# Patient Record
Sex: Female | Born: 1980 | Race: White | Hispanic: No | Marital: Married | Smoking: Never smoker
Health system: Southern US, Community
[De-identification: ages and names within clinical notes are randomized; demographics above are authoritative.]

## PROBLEM LIST (undated history)

## (undated) DIAGNOSIS — N189 Chronic kidney disease, unspecified: Secondary | ICD-10-CM

## (undated) DIAGNOSIS — R011 Cardiac murmur, unspecified: Secondary | ICD-10-CM

## (undated) DIAGNOSIS — N200 Calculus of kidney: Secondary | ICD-10-CM

## (undated) DIAGNOSIS — D649 Anemia, unspecified: Secondary | ICD-10-CM

## (undated) HISTORY — DX: Anemia, unspecified: D64.9

## (undated) HISTORY — PX: OTHER SURGICAL HISTORY: SHX169

## (undated) HISTORY — PX: WISDOM TOOTH EXTRACTION: SHX21

## (undated) HISTORY — DX: Cardiac murmur, unspecified: R01.1

---
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
## 2010-04-26 ENCOUNTER — Ambulatory Visit (HOSPITAL_COMMUNITY): Admission: RE | Admit: 2010-04-26 | Discharge: 2010-04-26 | Payer: Self-pay | Admitting: Urology

## 2011-04-13 LAB — ANTIBODY SCREEN: Antibody Screen: NEGATIVE

## 2011-04-13 LAB — CBC
HCT: 37 % (ref 36–46)
Hemoglobin: 12.4 g/dL (ref 12.0–16.0)

## 2011-04-13 LAB — RPR: RPR: NONREACTIVE

## 2011-08-19 IMAGING — NM NM RENAL IMAGING FLOW W/ PHARM
2 series · 12 of 12 positions shown · non-contrast
Comparison: CT urogram from [HOSPITAL] 04/12/2010.

CLINICAL DATA: Nonfunctioning right kidney.  History of right
kidney stone surgery in 1879.

NUCLEAR MEDICINE RENAL SCINTIANGIOGRAPHY WITH FLOW AND FUNCTION AND
PHARMACOLOGIC AUGMENTATION
TECHNIQUE: Radionuclide angiographic and sequential renal images
were obtained after intravenous injection of radiopharmaceutical.
Imaging was continued during slow intravenous injection of Lasix
approximately 20-30 minutes after the start of the examination.
Radiopharmaceutical: 15.0 mCi technetium 99m MAG3 intravenously.

[Series 1: re renal qualitative · 9.44mm/px · 6 of 121 frames shown (1 of 2)]
[frame 11/121]
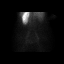
[frame 31/121]
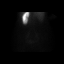
[frame 51/121]
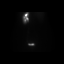
[frame 71/121]
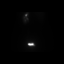
[frame 91/121]
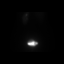
[frame 111/121]
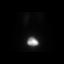

[Series 1: re renal qualitative · 9.44mm/px · 6 of 121 frames shown (2 of 2)]
[frame 11/121]
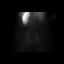
[frame 31/121]
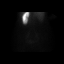
[frame 51/121]
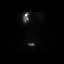
[frame 71/121]
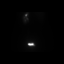
[frame 91/121]
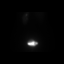
[frame 111/121]
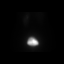

[12 of 12 positions shown; findings below may reference images not displayed]

FINDINGS: Perfusion images demonstrate a good aortic bolus with
normal early enhancement of the left kidney.  There is delayed
minimal right renal enhancement.  Differential renal uptake at 2-3
minutes is 2% on the right and 98% on the left.

Renogram images demonstrate normal left renal cortical uptake.
There is no definite right renal cortical uptake.  Activity in the
right upper quadrant is probably related to the liver.  There is
normal excretion into the left collecting system prior to Lasix
administration.  No definite collecting system activity is
demonstrated on the right.  There is progressive bladder activity.

Differential renal function is 3% right and 97% left.  Post Lasix T
[DATE] times are 6.0 minutes on the right and 6.5 minutes on the left.
IMPRESSION: 1.  Minimal if any right renal cortical uptake and excretion are
demonstrated.  Differential function is approximately 98% left and
2% right.
2.  No evidence of collecting system obstruction on the left.

## 2011-10-30 ENCOUNTER — Encounter (HOSPITAL_COMMUNITY): Payer: Self-pay | Admitting: Anesthesiology

## 2011-10-30 ENCOUNTER — Encounter (HOSPITAL_COMMUNITY): Payer: Self-pay

## 2011-10-30 ENCOUNTER — Inpatient Hospital Stay (HOSPITAL_COMMUNITY): Payer: 59 | Admitting: Anesthesiology

## 2011-10-30 ENCOUNTER — Inpatient Hospital Stay (HOSPITAL_COMMUNITY)
Admission: AD | Admit: 2011-10-30 | Discharge: 2011-11-01 | DRG: 775 | Disposition: A | Discharge status: Home / Self Care | Payer: 59 | Source: Ambulatory Visit | Attending: Obstetrics and Gynecology | Admitting: Obstetrics and Gynecology

## 2011-10-30 DIAGNOSIS — Z22330 Carrier of Group B streptococcus: Secondary | ICD-10-CM

## 2011-10-30 DIAGNOSIS — O99892 Other specified diseases and conditions complicating childbirth: Secondary | ICD-10-CM | POA: Diagnosis present

## 2011-10-30 HISTORY — DX: Calculus of kidney: N20.0

## 2011-10-30 HISTORY — DX: Chronic kidney disease, unspecified: N18.9

## 2011-10-30 LAB — URINALYSIS, MICROSCOPIC ONLY
Bilirubin Urine: NEGATIVE
Nitrite: NEGATIVE
Specific Gravity, Urine: 1.015 (ref 1.005–1.030)

## 2011-10-30 LAB — CBC
Hemoglobin: 12.2 g/dL (ref 12.0–15.0)
MCH: 30.9 pg (ref 26.0–34.0)
Platelets: 217 10*3/uL (ref 150–400)
RDW: 12 % (ref 11.5–15.5)
WBC: 11.5 10*3/uL — ABNORMAL HIGH (ref 4.0–10.5)

## 2011-10-30 LAB — COMPREHENSIVE METABOLIC PANEL
Alkaline Phosphatase: 128 U/L — ABNORMAL HIGH (ref 39–117)
BUN: 9 mg/dL (ref 6–23)
CO2: 23 mEq/L (ref 19–32)
Calcium: 9 mg/dL (ref 8.4–10.5)
GFR calc Af Amer: >90 mL/min (ref >90)
GFR calc non Af Amer: >90 mL/min (ref >90)
Glucose, Bld: 101 mg/dL — ABNORMAL HIGH (ref 70–99)
Sodium: 134 mEq/L — ABNORMAL LOW (ref 135–145)
Total Bilirubin: 0.2 mg/dL — ABNORMAL LOW (ref 0.3–1.2)

## 2011-10-30 MED ORDER — DEXTROSE 5 % IV SOLN
2.5000 10*6.[IU] | INTRAVENOUS | Status: DC
  Administered 2011-10-30 (×2): 2.5 10*6.[IU] via INTRAVENOUS
  Filled 2011-10-30 (×4): qty 2.5

## 2011-10-30 MED ORDER — MEASLES, MUMPS & RUBELLA VAC ~~LOC~~ INJ: 0.5000 mL | INJECTION | Freq: Once | SUBCUTANEOUS | Status: DC

## 2011-10-30 MED ORDER — IBUPROFEN 600 MG PO TABS
600.0000 mg | ORAL_TABLET | Freq: Four times a day (QID) | ORAL | Status: DC | PRN
  Administered 2011-10-30: 600 mg via ORAL
  Filled 2011-10-30: qty 1

## 2011-10-30 MED ORDER — FENTANYL 2.5 MCG/ML BUPIVACAINE 1/10 % EPIDURAL INFUSION (WH - ANES)
14.0000 mL/h | INTRAMUSCULAR | Status: DC
  Administered 2011-10-30 (×3): 14 mL/h via EPIDURAL
  Filled 2011-10-30 (×3): qty 60

## 2011-10-30 MED ORDER — LANOLIN HYDROUS EX OINT: TOPICAL_OINTMENT | CUTANEOUS | Status: DC | PRN

## 2011-10-30 MED ORDER — IBUPROFEN 600 MG PO TABS
600.0000 mg | ORAL_TABLET | Freq: Four times a day (QID) | ORAL | Status: DC
  Administered 2011-10-31 – 2011-11-01 (×5): 600 mg via ORAL
  Filled 2011-10-30 (×5): qty 1

## 2011-10-30 MED ORDER — BUTORPHANOL TARTRATE 2 MG/ML IJ SOLN: 1.0000 mg | INTRAMUSCULAR | Status: DC | PRN

## 2011-10-30 MED ORDER — BENZOCAINE-MENTHOL 20-0.5 % EX AERO
INHALATION_SPRAY | CUTANEOUS | Status: AC
  Filled 2011-10-30: qty 56

## 2011-10-30 MED ORDER — PHENYLEPHRINE 40 MCG/ML (10ML) SYRINGE FOR IV PUSH (FOR BLOOD PRESSURE SUPPORT): 80.0000 ug | PREFILLED_SYRINGE | INTRAVENOUS | Status: DC | PRN

## 2011-10-30 MED ORDER — DIPHENHYDRAMINE HCL 50 MG/ML IJ SOLN: 12.5000 mg | INTRAMUSCULAR | Status: DC | PRN

## 2011-10-30 MED ORDER — ONDANSETRON HCL 4 MG PO TABS: 4.0000 mg | ORAL_TABLET | ORAL | Status: DC | PRN

## 2011-10-30 MED ORDER — ACETAMINOPHEN 325 MG PO TABS
650.0000 mg | ORAL_TABLET | ORAL | Status: DC | PRN
  Administered 2011-10-30: 650 mg via ORAL
  Filled 2011-10-30: qty 2

## 2011-10-30 MED ORDER — LIDOCAINE HCL (PF) 1 % IJ SOLN
30.0000 mL | INTRAMUSCULAR | Status: DC | PRN
  Filled 2011-10-30: qty 30

## 2011-10-30 MED ORDER — PENICILLIN G POTASSIUM 5000000 UNITS IJ SOLR
5.0000 10*6.[IU] | Freq: Once | INTRAVENOUS | Status: AC
  Administered 2011-10-30: 5 10*6.[IU] via INTRAVENOUS
  Filled 2011-10-30: qty 5

## 2011-10-30 MED ORDER — EPHEDRINE 5 MG/ML INJ
10.0000 mg | INTRAVENOUS | Status: DC | PRN
  Filled 2011-10-30: qty 4

## 2011-10-30 MED ORDER — LACTATED RINGERS IV SOLN
INTRAVENOUS | Status: DC
  Administered 2011-10-30 (×2): via INTRAVENOUS

## 2011-10-30 MED ORDER — MEDROXYPROGESTERONE ACETATE 150 MG/ML IM SUSP: 150.0000 mg | INTRAMUSCULAR | Status: DC | PRN

## 2011-10-30 MED ORDER — OXYTOCIN BOLUS FROM INFUSION
500.0000 mL | Freq: Once | INTRAVENOUS | Status: AC
  Administered 2011-10-30: 500 mL via INTRAVENOUS
  Filled 2011-10-30: qty 500
  Filled 2011-10-30: qty 1000

## 2011-10-30 MED ORDER — SIMETHICONE 80 MG PO CHEW: 80.0000 mg | CHEWABLE_TABLET | ORAL | Status: DC | PRN

## 2011-10-30 MED ORDER — BENZONATATE 100 MG PO CAPS: 100.0000 mg | ORAL_CAPSULE | Freq: Three times a day (TID) | ORAL | Status: DC | PRN

## 2011-10-30 MED ORDER — FLEET ENEMA 7-19 GM/118ML RE ENEM: 1.0000 | ENEMA | RECTAL | Status: DC | PRN

## 2011-10-30 MED ORDER — OXYTOCIN 20 UNITS IN LACTATED RINGERS INFUSION - SIMPLE: 125.0000 mL/h | Freq: Once | INTRAVENOUS | Status: DC

## 2011-10-30 MED ORDER — BENZONATATE 100 MG PO CAPS
100.0000 mg | ORAL_CAPSULE | Freq: Three times a day (TID) | ORAL | Status: DC | PRN
  Administered 2011-10-30: 100 mg via ORAL
  Filled 2011-10-30: qty 1

## 2011-10-30 MED ORDER — LACTATED RINGERS IV SOLN: 500.0000 mL | Freq: Once | INTRAVENOUS | Status: DC

## 2011-10-30 MED ORDER — OXYCODONE-ACETAMINOPHEN 5-325 MG PO TABS: 1.0000 | ORAL_TABLET | ORAL | Status: DC | PRN

## 2011-10-30 MED ORDER — TETANUS-DIPHTH-ACELL PERTUSSIS 5-2.5-18.5 LF-MCG/0.5 IM SUSP: 0.5000 mL | Freq: Once | INTRAMUSCULAR | Status: DC

## 2011-10-30 MED ORDER — PRENATAL MULTIVITAMIN CH
1.0000 | ORAL_TABLET | Freq: Every day | ORAL | Status: DC
  Administered 2011-10-31: 1 via ORAL
  Filled 2011-10-30 (×2): qty 1

## 2011-10-30 MED ORDER — BENZOCAINE-MENTHOL 20-0.5 % EX AERO: 1.0000 "application " | INHALATION_SPRAY | CUTANEOUS | Status: DC | PRN

## 2011-10-30 MED ORDER — ONDANSETRON HCL 4 MG/2ML IJ SOLN: 4.0000 mg | INTRAMUSCULAR | Status: DC | PRN

## 2011-10-30 MED ORDER — LACTATED RINGERS IV SOLN: 500.0000 mL | INTRAVENOUS | Status: DC | PRN

## 2011-10-30 MED ORDER — WITCH HAZEL-GLYCERIN EX PADS: 1.0000 "application " | MEDICATED_PAD | CUTANEOUS | Status: DC | PRN

## 2011-10-30 MED ORDER — SENNOSIDES-DOCUSATE SODIUM 8.6-50 MG PO TABS
2.0000 | ORAL_TABLET | Freq: Every day | ORAL | Status: DC
  Administered 2011-10-31: 2 via ORAL

## 2011-10-30 MED ORDER — PHENYLEPHRINE 40 MCG/ML (10ML) SYRINGE FOR IV PUSH (FOR BLOOD PRESSURE SUPPORT)
80.0000 ug | PREFILLED_SYRINGE | INTRAVENOUS | Status: DC | PRN
  Filled 2011-10-30: qty 5

## 2011-10-30 MED ORDER — EPHEDRINE 5 MG/ML INJ: 10.0000 mg | INTRAVENOUS | Status: DC | PRN

## 2011-10-30 MED ORDER — ONDANSETRON HCL 4 MG/2ML IJ SOLN
4.0000 mg | Freq: Four times a day (QID) | INTRAMUSCULAR | Status: DC | PRN
Start: 2011-10-30 — End: 2011-10-30

## 2011-10-30 MED ORDER — DIBUCAINE 1 % RE OINT
1.0000 "application " | TOPICAL_OINTMENT | RECTAL | Status: DC | PRN
  Administered 2011-10-31: 1 via RECTAL
  Filled 2011-10-30: qty 28

## 2011-10-30 MED ORDER — LIDOCAINE HCL 1.5 % IJ SOLN
INTRAMUSCULAR | Status: DC | PRN
  Administered 2011-10-30 (×2): 5 mL via EPIDURAL

## 2011-10-30 MED ORDER — CITRIC ACID-SODIUM CITRATE 334-500 MG/5ML PO SOLN: 30.0000 mL | ORAL | Status: DC | PRN

## 2011-10-30 MED ORDER — DIPHENHYDRAMINE HCL 25 MG PO CAPS: 25.0000 mg | ORAL_CAPSULE | Freq: Four times a day (QID) | ORAL | Status: DC | PRN

## 2011-10-30 NOTE — Progress Notes (Signed)
Comfortable w/ epidural.   FHT reassuring Toco Q4-6 Cvx 5/90/-1 AROM - blood tinged or light mec  A/P:  Exp mngt

## 2011-10-30 NOTE — Progress Notes (Signed)
SVD of vigerous female infant w/ apgars of 8,9.  Placenta delivered spontaneous w/ 3VC.   2nd degree lac repaired w/ 3-0 vicryl rapide.  Fundus firm.

## 2011-10-30 NOTE — Progress Notes (Signed)
Pt pushing w/ good effort.    FHT reassuring toco Q2-3 Cvx +2 station, no progress in descent since eval ago  A/P:  Continue pushing and reassess for progress

## 2011-10-30 NOTE — Anesthesia Preprocedure Evaluation (Signed)
Anesthesia Evaluation  Patient identified by MRN, date of birth, ID band Patient awake    Reviewed: Allergy & Precautions, H&P , Patient's Chart, lab work & pertinent test results  Airway Mallampati: II TM Distance: >3 FB Neck ROM: full    Dental No notable dental hx.    Pulmonary neg pulmonary ROS,  clear to auscultation  Pulmonary exam normal       Cardiovascular neg cardio ROS regular Normal    Neuro/Psych Negative Neurological ROS  Negative Psych ROS   GI/Hepatic negative GI ROS, Neg liver ROS,   Endo/Other  Negative Endocrine ROS  Renal/GU Renal diseasenegative Renal ROS     Musculoskeletal   Abdominal   Peds  Hematology negative hematology ROS (+)   Anesthesia Other Findings   Reproductive/Obstetrics (+) Pregnancy                           Anesthesia Physical Anesthesia Plan  ASA: III  Anesthesia Plan: Epidural   Post-op Pain Management:    Induction:   Airway Management Planned:   Additional Equipment:   Intra-op Plan:   Post-operative Plan:   Informed Consent: I have reviewed the patients History and Physical, chart, labs and discussed the procedure including the risks, benefits and alternatives for the proposed anesthesia with the patient or authorized representative who has indicated his/her understanding and acceptance.     Plan Discussed with:   Anesthesia Plan Comments:         Anesthesia Quick Evaluation

## 2011-10-30 NOTE — Progress Notes (Signed)
Patient state she has been having contractions every 5 minutes for the past 2 hours. Denies any leaking or bleeding and reports good fetal movement.

## 2011-10-30 NOTE — Progress Notes (Signed)
30 yo G1 @ 39+2 wks presents w/ labor.  No vb or lof.  Good FM.  Pregnancy uncomplicated.  Past History - See hollister, GBS +  AF, VSS BP 130-140/80s Gen - uncomfortable w/ ctx Abd - gravid, NT Ext - NT, no edema Cvx 4cm (changed from 3cm per RN exam)  A/P:  Admit Epidural prn PCN  

## 2011-10-30 NOTE — Plan of Care (Signed)
Problem: Consults Goal: Birthing Suites Patient Information Press F2 to bring up selections list  Outcome: Completed/Met Date Met:  10/30/11  Pt 37-[redacted] weeks EGA

## 2011-10-30 NOTE — Anesthesia Procedure Notes (Signed)
Epidural Patient location during procedure: OB Start time: 10/30/2011 12:39 PM  Staffing Anesthesiologist: Brayton Caves R Performed by: anesthesiologist   Preanesthetic Checklist Completed: patient identified, site marked, surgical consent, pre-op evaluation, timeout performed, IV checked, risks and benefits discussed and monitors and equipment checked  Epidural Patient position: sitting Prep: site prepped and draped and DuraPrep Patient monitoring: continuous pulse ox and blood pressure Approach: midline Injection technique: LOR air and LOR saline  Needle:  Needle type: Tuohy  Needle gauge: 17 G Needle length: 9 cm Needle insertion depth: 5 cm cm Catheter type: closed end flexible Catheter size: 19 Gauge Catheter at skin depth: 10 cm Test dose: negative  Assessment Events: blood not aspirated, injection not painful, no injection resistance, negative IV test and no paresthesia  Additional Notes Patient identified.  Risk benefits discussed including failed block, incomplete pain control, headache, nerve damage, paralysis, blood pressure changes, nausea, vomiting, reactions to medication both toxic or allergic, and postpartum back pain.  Patient expressed understanding and wished to proceed.  All questions were answered.  Sterile technique used throughout procedure and epidural site dressed with sterile barrier dressing. No paresthesia or other complications noted.The patient did not experience any signs of intravascular injection such as tinnitus or metallic taste in mouth nor signs of intrathecal spread such as rapid motor block. Please see nursing notes for vital signs.

## 2011-10-30 NOTE — H&P (Signed)
31 yo G1 @ 39+2 wks presents w/ labor.  No vb or lof.  Good FM.  Pregnancy uncomplicated.  Past History - See hollister, GBS +  AF, VSS BP 130-140/80s Gen - uncomfortable w/ ctx Abd - gravid, NT Ext - NT, no edema Cvx 4cm (changed from 3cm per RN exam)  A/P:  Admit Epidural prn PCN

## 2011-10-31 LAB — CBC
HCT: 30.6 % — ABNORMAL LOW (ref 36.0–46.0)
MCHC: 33.7 g/dL (ref 30.0–36.0)
Platelets: 201 10*3/uL (ref 150–400)
RDW: 12.3 % (ref 11.5–15.5)
WBC: 20.6 10*3/uL — ABNORMAL HIGH (ref 4.0–10.5)

## 2011-10-31 NOTE — Anesthesia Postprocedure Evaluation (Signed)
  Anesthesia Post-op Note  Patient: Christina Montes  Procedure(s) Performed: * No procedures listed *  Patient Location: Mother/Baby  Anesthesia Type: Epidural  Level of Consciousness: alert  and oriented  Airway and Oxygen Therapy: Patient Spontanous Breathing  Post-op Pain: mild  Post-op Assessment: Patient's Cardiovascular Status Stable and Respiratory Function Stable  Post-op Vital Signs: stable  Complications: No apparent anesthesia complications

## 2011-10-31 NOTE — Progress Notes (Signed)
Post Partum Day 1 Subjective: no complaints, up ad lib, voiding and tolerating PO  Objective: Blood pressure 100/64, pulse 60, temperature 97.9 F (36.6 C), temperature source Oral, resp. rate 18, height 5\' 3"  (1.6 m), weight 72.848 kg (160 lb 9.6 oz), SpO2 96.00%, unknown if currently breastfeeding.  Physical Exam:  General: alert and cooperative Lochia: appropriate Uterine Fundus: firm Perineum intact DVT Evaluation: No evidence of DVT seen on physical exam.   Basename 10/31/11 0535 10/30/11 0931  HGB 10.3* 12.2  HCT 30.6* 35.9*    Assessment/Plan: Plan for discharge tomorrow   LOS: 1 day   CURTIS,CAROL G 10/31/2011, 8:47 AM

## 2011-11-01 MED ORDER — IBUPROFEN 600 MG PO TABS: 600.0000 mg | ORAL_TABLET | Freq: Four times a day (QID) | ORAL | Status: AC

## 2011-11-01 NOTE — Discharge Summary (Signed)
Obstetric Discharge Summary Reason for Admission: onset of labor Prenatal Procedures: ultrasound Intrapartum Procedures: spontaneous vaginal delivery Postpartum Procedures: none Complications-Operative and Postpartum: 2 degree perineal laceration Hemoglobin  Date Value Range Status  10/31/2011 10.3* 12.0-15.0 (g/dL) Final     HCT  Date Value Range Status  10/31/2011 30.6* 36.0-46.0 (%) Final    Discharge Diagnoses: Term Pregnancy-delivered  Discharge Information: Date: 11/01/2011 Activity: pelvic rest Diet: routine Medications: PNV and Ibuprofen Condition: stable Instructions: refer to practice specific booklet Discharge to: home   Newborn Data: Live born female  Birth Weight: 6 lb 0.1 oz (2724 g) APGAR: 8, 9  Home with mother.  Lisa Blakeman G 11/01/2011, 7:56 AM

## 2011-11-01 NOTE — Progress Notes (Signed)
Post Partum Day 2 Subjective: no complaints, up ad lib, voiding, tolerating PO and + flatus  Objective: Blood pressure 122/67, pulse 60, temperature 97.9 F (36.6 C), temperature source Oral, resp. rate 18, height 5\' 3"  (1.6 m), weight 72.848 kg (160 lb 9.6 oz), SpO2 96.00%, unknown if currently breastfeeding.  Physical Exam:  General: alert and cooperative Lochia: appropriate Uterine Fundus: firm Perineum intact DVT Evaluation: No evidence of DVT seen on physical exam.   Basename 10/31/11 0535 10/30/11 0931  HGB 10.3* 12.2  HCT 30.6* 35.9*    Assessment/Plan: Discharge home   LOS: 2 days   Parv Manthey G 11/01/2011, 7:48 AM

## 2013-09-25 NOTE — L&D Delivery Note (Signed)
Delivery Note At 3:13 AM a viable female was delivered via Vaginal, Spontaneous Delivery (Presentation: Left Occiput Anterior).  APGAR: 8, 9; weight  .   Placenta status: Intact, Spontaneous.  Cord: 3 vessels with the following complications: None.  Cord pH: na  Anesthesia: None  Episiotomy: None Lacerations: first degree  Suture Repair: 2.0 vicryl rapide Est. Blood Loss (mL):  350  Mom to postpartum.  Baby to Couplet care / Skin to Skin.  Christina Montes 08/21/2014, 3:29 AM

## 2014-01-19 LAB — OB RESULTS CONSOLE RPR: RPR: NONREACTIVE

## 2014-01-19 LAB — OB RESULTS CONSOLE GC/CHLAMYDIA
Chlamydia: NEGATIVE
Gonorrhea: NEGATIVE

## 2014-01-19 LAB — OB RESULTS CONSOLE HIV ANTIBODY (ROUTINE TESTING): HIV: NONREACTIVE

## 2014-01-19 LAB — OB RESULTS CONSOLE ABO/RH: RH TYPE: POSITIVE

## 2014-01-19 LAB — OB RESULTS CONSOLE RUBELLA ANTIBODY, IGM: Rubella: IMMUNE

## 2014-01-19 LAB — OB RESULTS CONSOLE ANTIBODY SCREEN: Antibody Screen: NEGATIVE

## 2014-01-19 LAB — OB RESULTS CONSOLE HEPATITIS B SURFACE ANTIGEN: Hepatitis B Surface Ag: NEGATIVE

## 2014-07-27 ENCOUNTER — Encounter (HOSPITAL_COMMUNITY): Payer: Self-pay

## 2014-07-27 LAB — OB RESULTS CONSOLE GBS
GBS: POSITIVE
STREP GROUP B AG: POSITIVE

## 2014-08-20 ENCOUNTER — Inpatient Hospital Stay (HOSPITAL_COMMUNITY)
Admission: AD | Admit: 2014-08-20 | Discharge: 2014-08-23 | DRG: 775 | Disposition: A | Discharge status: Home / Self Care | Payer: Managed Care, Other (non HMO) | Source: Ambulatory Visit | Attending: Obstetrics and Gynecology | Admitting: Obstetrics and Gynecology

## 2014-08-20 DIAGNOSIS — Z3A00 Weeks of gestation of pregnancy not specified: Secondary | ICD-10-CM | POA: Diagnosis present

## 2014-08-20 DIAGNOSIS — O99824 Streptococcus B carrier state complicating childbirth: Principal | ICD-10-CM | POA: Diagnosis present

## 2014-08-21 ENCOUNTER — Encounter (HOSPITAL_COMMUNITY): Payer: Self-pay | Admitting: *Deleted

## 2014-08-21 DIAGNOSIS — Z3A00 Weeks of gestation of pregnancy not specified: Secondary | ICD-10-CM | POA: Diagnosis present

## 2014-08-21 DIAGNOSIS — Z3483 Encounter for supervision of other normal pregnancy, third trimester: Secondary | ICD-10-CM | POA: Diagnosis present

## 2014-08-21 DIAGNOSIS — O99824 Streptococcus B carrier state complicating childbirth: Secondary | ICD-10-CM | POA: Diagnosis present

## 2014-08-21 LAB — CBC
HCT: 36.4 % (ref 36.0–46.0)
HCT: 34 % — ABNORMAL LOW (ref 36.0–46.0)
Hemoglobin: 12.2 g/dL (ref 12.0–15.0)
Hemoglobin: 11.3 g/dL — ABNORMAL LOW (ref 12.0–15.0)
MCH: 31.2 pg (ref 26.0–34.0)
MCH: 31.3 pg (ref 26.0–34.0)
MCHC: 33.5 g/dL (ref 30.0–36.0)
MCHC: 33.2 g/dL (ref 30.0–36.0)
MCV: 93.1 fL (ref 78.0–100.0)
MCV: 94.2 fL (ref 78.0–100.0)
PLATELETS: 240 10*3/uL (ref 150–400)
PLATELETS: 212 10*3/uL (ref 150–400)
RBC: 3.91 MIL/uL (ref 3.87–5.11)
RBC: 3.61 MIL/uL — AB (ref 3.87–5.11)
RDW: 12.3 % (ref 11.5–15.5)
RDW: 12.3 % (ref 11.5–15.5)
WBC: 11.3 10*3/uL — ABNORMAL HIGH (ref 4.0–10.5)
WBC: 17.6 10*3/uL — ABNORMAL HIGH (ref 4.0–10.5)

## 2014-08-21 LAB — RPR

## 2014-08-21 MED ORDER — PENICILLIN G POTASSIUM 5000000 UNITS IJ SOLR
2.5000 10*6.[IU] | INTRAVENOUS | Status: DC
  Filled 2014-08-21 (×3): qty 2.5

## 2014-08-21 MED ORDER — LACTATED RINGERS IV SOLN
INTRAVENOUS | Status: DC
  Administered 2014-08-21: 03:00:00 via INTRAVENOUS

## 2014-08-21 MED ORDER — PRENATAL MULTIVITAMIN CH
1.0000 | ORAL_TABLET | Freq: Every day | ORAL | Status: DC
  Administered 2014-08-21 – 2014-08-22 (×2): 1 via ORAL
  Filled 2014-08-21 (×2): qty 1

## 2014-08-21 MED ORDER — IBUPROFEN 600 MG PO TABS
600.0000 mg | ORAL_TABLET | Freq: Four times a day (QID) | ORAL | Status: DC
  Administered 2014-08-21 – 2014-08-23 (×9): 600 mg via ORAL
  Filled 2014-08-21 (×9): qty 1

## 2014-08-21 MED ORDER — LIDOCAINE HCL (PF) 1 % IJ SOLN
30.0000 mL | INTRAMUSCULAR | Status: DC | PRN
  Administered 2014-08-21: 30 mL via SUBCUTANEOUS
  Filled 2014-08-21: qty 30

## 2014-08-21 MED ORDER — BISACODYL 10 MG RE SUPP: 10.0000 mg | Freq: Every day | RECTAL | Status: DC | PRN

## 2014-08-21 MED ORDER — PHENYLEPHRINE 40 MCG/ML (10ML) SYRINGE FOR IV PUSH (FOR BLOOD PRESSURE SUPPORT)
80.0000 ug | PREFILLED_SYRINGE | INTRAVENOUS | Status: DC | PRN
Start: 2014-08-21 — End: 2014-08-21
  Filled 2014-08-21: qty 2

## 2014-08-21 MED ORDER — CITRIC ACID-SODIUM CITRATE 334-500 MG/5ML PO SOLN: 30.0000 mL | ORAL | Status: DC | PRN

## 2014-08-21 MED ORDER — FENTANYL 2.5 MCG/ML BUPIVACAINE 1/10 % EPIDURAL INFUSION (WH - ANES): 14.0000 mL/h | INTRAMUSCULAR | Status: DC | PRN

## 2014-08-21 MED ORDER — OXYTOCIN BOLUS FROM INFUSION
500.0000 mL | INTRAVENOUS | Status: DC
  Administered 2014-08-21: 500 mL via INTRAVENOUS

## 2014-08-21 MED ORDER — PHENYLEPHRINE 40 MCG/ML (10ML) SYRINGE FOR IV PUSH (FOR BLOOD PRESSURE SUPPORT)
80.0000 ug | PREFILLED_SYRINGE | INTRAVENOUS | Status: DC | PRN
  Filled 2014-08-21: qty 2

## 2014-08-21 MED ORDER — OXYCODONE-ACETAMINOPHEN 5-325 MG PO TABS: 1.0000 | ORAL_TABLET | ORAL | Status: DC | PRN

## 2014-08-21 MED ORDER — TETANUS-DIPHTH-ACELL PERTUSSIS 5-2.5-18.5 LF-MCG/0.5 IM SUSP: 0.5000 mL | Freq: Once | INTRAMUSCULAR | Status: DC

## 2014-08-21 MED ORDER — DIPHENHYDRAMINE HCL 25 MG PO CAPS: 25.0000 mg | ORAL_CAPSULE | Freq: Four times a day (QID) | ORAL | Status: DC | PRN

## 2014-08-21 MED ORDER — BENZOCAINE-MENTHOL 20-0.5 % EX AERO
1.0000 "application " | INHALATION_SPRAY | CUTANEOUS | Status: DC | PRN
  Filled 2014-08-21: qty 56

## 2014-08-21 MED ORDER — ONDANSETRON HCL 4 MG PO TABS: 4.0000 mg | ORAL_TABLET | ORAL | Status: DC | PRN

## 2014-08-21 MED ORDER — ACETAMINOPHEN 325 MG PO TABS: 650.0000 mg | ORAL_TABLET | ORAL | Status: DC | PRN

## 2014-08-21 MED ORDER — ONDANSETRON HCL 4 MG/2ML IJ SOLN: 4.0000 mg | INTRAMUSCULAR | Status: DC | PRN

## 2014-08-21 MED ORDER — DIBUCAINE 1 % RE OINT
1.0000 "application " | TOPICAL_OINTMENT | RECTAL | Status: DC | PRN
  Filled 2014-08-21: qty 28

## 2014-08-21 MED ORDER — ZOLPIDEM TARTRATE 5 MG PO TABS: 5.0000 mg | ORAL_TABLET | Freq: Every evening | ORAL | Status: DC | PRN

## 2014-08-21 MED ORDER — FLEET ENEMA 7-19 GM/118ML RE ENEM: 1.0000 | ENEMA | RECTAL | Status: DC | PRN

## 2014-08-21 MED ORDER — WITCH HAZEL-GLYCERIN EX PADS: 1.0000 "application " | MEDICATED_PAD | CUTANEOUS | Status: DC | PRN

## 2014-08-21 MED ORDER — OXYCODONE-ACETAMINOPHEN 5-325 MG PO TABS
2.0000 | ORAL_TABLET | ORAL | Status: DC | PRN
  Administered 2014-08-22: 2 via ORAL
  Filled 2014-08-21: qty 2

## 2014-08-21 MED ORDER — ONDANSETRON HCL 4 MG/2ML IJ SOLN: 4.0000 mg | Freq: Four times a day (QID) | INTRAMUSCULAR | Status: DC | PRN

## 2014-08-21 MED ORDER — OXYCODONE-ACETAMINOPHEN 5-325 MG PO TABS
1.0000 | ORAL_TABLET | ORAL | Status: DC | PRN
  Administered 2014-08-21 (×2): 1 via ORAL
  Filled 2014-08-21 (×2): qty 1

## 2014-08-21 MED ORDER — PENICILLIN G POTASSIUM 5000000 UNITS IJ SOLR
5.0000 10*6.[IU] | Freq: Once | INTRAVENOUS | Status: DC
  Administered 2014-08-21: 5 10*6.[IU] via INTRAVENOUS
  Filled 2014-08-21: qty 5

## 2014-08-21 MED ORDER — LACTATED RINGERS IV SOLN: 500.0000 mL | INTRAVENOUS | Status: DC | PRN

## 2014-08-21 MED ORDER — SENNOSIDES-DOCUSATE SODIUM 8.6-50 MG PO TABS
2.0000 | ORAL_TABLET | ORAL | Status: DC
  Administered 2014-08-21 – 2014-08-22 (×2): 2 via ORAL
  Filled 2014-08-21 (×2): qty 2

## 2014-08-21 MED ORDER — SIMETHICONE 80 MG PO CHEW: 80.0000 mg | CHEWABLE_TABLET | ORAL | Status: DC | PRN

## 2014-08-21 MED ORDER — OXYCODONE-ACETAMINOPHEN 5-325 MG PO TABS: 2.0000 | ORAL_TABLET | ORAL | Status: DC | PRN

## 2014-08-21 MED ORDER — LANOLIN HYDROUS EX OINT: TOPICAL_OINTMENT | CUTANEOUS | Status: DC | PRN

## 2014-08-21 MED ORDER — FLEET ENEMA 7-19 GM/118ML RE ENEM: 1.0000 | ENEMA | Freq: Every day | RECTAL | Status: DC | PRN

## 2014-08-21 MED ORDER — EPHEDRINE 5 MG/ML INJ
10.0000 mg | INTRAVENOUS | Status: DC | PRN
  Filled 2014-08-21: qty 2

## 2014-08-21 MED ORDER — DIPHENHYDRAMINE HCL 50 MG/ML IJ SOLN: 12.5000 mg | INTRAMUSCULAR | Status: DC | PRN

## 2014-08-21 MED ORDER — LACTATED RINGERS IV SOLN: 500.0000 mL | Freq: Once | INTRAVENOUS | Status: DC

## 2014-08-21 MED ORDER — OXYTOCIN 40 UNITS IN LACTATED RINGERS INFUSION - SIMPLE MED
62.5000 mL/h | INTRAVENOUS | Status: DC
  Filled 2014-08-21: qty 1000

## 2014-08-21 NOTE — Plan of Care (Signed)
Problem: Discharge Progression Outcomes Goal: Barriers To Progression Addressed/Resolved Outcome: Completed/Met Date Met:  08/21/14     

## 2014-08-21 NOTE — Plan of Care (Signed)
Problem: Consults Goal: Nutrition Consult-if indicated Outcome: Not Applicable Date Met:  22/57/50  Problem: Phase II Progression Outcomes Goal: Pain controlled on oral analgesia Outcome: Completed/Met Date Met:  08/21/14 Goal: Progress activity as tolerated unless otherwise ordered Outcome: Completed/Met Date Met:  08/21/14 Goal: Afebrile, VS remain stable Outcome: Completed/Met Date Met:  08/21/14 Goal: Tolerating diet Outcome: Completed/Met Date Met:  08/21/14 Goal: Other Phase II Outcomes/Goals Outcome: Completed/Met Date Met:  08/21/14  Problem: Discharge Progression Outcomes Goal: Tolerating diet Outcome: Completed/Met Date Met:  08/21/14 Goal: Pain controlled with appropriate interventions Outcome: Completed/Met Date Met:  08/21/14 Goal: Discharge plan in place and appropriate Outcome: Completed/Met Date Met:  08/21/14

## 2014-08-21 NOTE — Lactation Note (Signed)
This note was copied from the chart of Christina Louretta Parmashley Mcphillips. Lactation Consultation Note  Mother states she was diagnosed with poor glandular tissue with her older child and low milk supply. The most she was able to pump with older child was 30 ml.  Mother had good attitude. Is willing to try with this baby. Mother hand expressed drops of colostrum. States she will let us know when she wants to start pumping.  Encouraged mother. Mother latched baby in football hold.  Sucks and some swallows observed.  Mother massaged breast during feeding. Mom encouraged to feed baby 8-12 times/24 hours and with feeding cues.  Mom made aware of O/P services, breastfeeding support groups, community resources, and our phone # for post-discharge questions.       Patient Name: Christina Montes [id number]'s Date: 08/21/2014 Reason for consult: Initial assessment   Maternal Data Has patient been taught Hand Expression?: Yes Does the patient have breastfeeding experience prior to this delivery?: Yes  Feeding Feeding Type: Breast Fed  LATCH Score/Interventions Latch: Grasps breast easily, tongue down, lips flanged, rhythmical sucking.  Audible Swallowing: A few with stimulation  Type of Nipple: Everted at rest and after stimulation  Comfort (Breast/Nipple): Filling, red/small blisters or bruises, mild/mod discomfort  Problem noted: Mild/Moderate discomfort Interventions (Mild/moderate discomfort): Hand expression  Hold (Positioning): No assistance needed to correctly position infant at breast.  LATCH Score: 8  Lactation Tools Discussed/Used     Consult Status Consult Status: Follow-up Date: 08/22/14 Follow-up type: In-patient    Dahlia ByesBerkelhammer, Tashiya Souders [facility] 08/21/2014, 3:11 PM

## 2014-08-21 NOTE — H&P (Signed)
Christina Montes is a 33 y.o. female presenting at term with  SOL.  Post GBS. Maternal Medical History:  Reason for admission: Contractions.   Contractions: Onset was 1-2 hours ago.   Frequency: regular.   Perceived severity is moderate.    Fetal activity: Perceived fetal activity is normal.    Prenatal complications: no prenatal complications Prenatal Complications - Diabetes: none.    OB History    Gravida Para Term Preterm AB TAB SAB Ectopic Multiple Living   3 1 1       1      Past Medical History  Diagnosis Date  . Chronic kidney disease     1 functioning kidney, hx stones  . Kidney stones    Past Surgical History  Procedure Laterality Date  . Lithotripsy    . Surgical removal of kidney stones    . Wisdom tooth extraction     Family History: family history is not on file. Social History:  reports that she has never smoked. She has never used smokeless tobacco. She reports that she does not drink alcohol or use illicit drugs.   Prenatal Transfer Tool  Maternal Diabetes: No Genetic Screening: Normal Maternal Ultrasounds/Referrals: Normal Fetal Ultrasounds or other Referrals:  None Maternal Substance Abuse:  No Significant Maternal Medications:  None Significant Maternal Lab Results:  Lab values include: Group B Strep positive Other Comments:  None  ROS  Dilation: 10 Effacement (%): 100 Station: +2, +3 Exam by:: Bertram MillardJ. Lopez, rN Blood pressure 127/84, pulse 78, temperature 98.4 F (36.9 C), temperature source Oral, resp. rate 18, height 5\' 3"  (1.6 m), weight 83.008 kg (183 lb), last menstrual period 11/17/2013, SpO2 99 %, unknown if currently breastfeeding. Maternal Exam:  Uterine Assessment: Contraction strength is firm.  Contraction frequency is regular.   Abdomen: Patient reports no abdominal tenderness. Fetal presentation: vertex  Pelvis: adequate for delivery.   Cervix: completet     Fetal Exam Fetal State Assessment: Category I - tracings are  normal.     Physical Exam  Prenatal labs: ABO, Rh: O/Positive/-- (04/27 0000) Antibody: Negative (04/27 0000) Rubella: Immune (04/27 0000) RPR: Nonreactive (04/27 0000)  HBsAg: Negative (04/27 0000)  HIV: Non-reactive (04/27 0000)  GBS: Positive, Positive (11/02 0000)   Assessment/Plan: IUP at term with SOL Post GBS Routine L and D   Christina Montes 08/21/2014, 3:26 AM

## 2014-08-21 NOTE — Progress Notes (Signed)
Post Partum Day 0 Subjective: no complaints  Objective: Blood pressure 129/61, pulse 71, temperature 98.5 F (36.9 C), temperature source Oral, resp. rate 18, height 5\' 3"  (1.6 m), weight 183 lb (83.008 kg), last menstrual period 11/17/2013, SpO2 99 %, unknown if currently breastfeeding.  Physical Exam:  General: alert Lochia: appropriate Uterine Fundus: firm Incision: healing well DVT Evaluation: No evidence of DVT seen on physical exam.   Recent Labs  08/21/14 0225 08/21/14 0530  HGB 12.2 11.3*  HCT 36.4 34.0*    Assessment/Plan: Plan for discharge tomorrow   LOS: 1 day   Christina Montes M 08/21/2014, 8:20 AM

## 2014-08-21 NOTE — Plan of Care (Signed)
Problem: Discharge Progression Outcomes Goal: Complications resolved/controlled Outcome: Completed/Met Date Met:  08/21/14

## 2014-08-22 NOTE — Plan of Care (Signed)
Problem: Discharge Progression Outcomes Goal: Activity appropriate for discharge plan Outcome: Completed/Met Date Met:  08/22/14 Goal: Afebrile, VS remain stable at discharge Outcome: Completed/Met Date Met:  08/22/14 Goal: Other Discharge Outcomes/Goals Outcome: Completed/Met Date Met:  08/22/14

## 2014-08-22 NOTE — Lactation Note (Signed)
This note was copied from the chart of Boy Louretta Parmashley Eich. Lactation Consultation Note  Patient Name: Boy Louretta Parmashley Rossetti [id number]'s Date: 08/22/2014   Visited with Mom, baby 5231 hrs old.  Mom continues to breast feed on cue, and supplementing with 10-12 ml of formula due to her history of insufficient glandular tissue with her first child.  Latch scores 8 consistently. Mom encouraged to add double electric pumping after breast feeding, but Mom choosing to hold off on extra pumping at this time.  Both nipples sore, reddened, and she describes a pinching feeling.  Offered to assess the next feeding, [facility]  Comfort Gels given to Mom during night.  Mom states gels are helping.  Follow up in am, and prn    Judee ClaraSmith, Blessin Kanno E 08/22/2014, 10:41 AM

## 2014-08-22 NOTE — Lactation Note (Signed)
This note was copied from the chart of Christina Montes. Lactation Consultation Note  Patient Name: Christina Louretta Parmashley Ernsberger [id number]'s Date: 08/22/2014 Reason for consult: Follow-up assessment;Breast/nipple pain  Assessed Mom latching baby in football hold.  Both nipples very reddened, but no blisters or bruises noted.  Colostrum easily expressed.  Baby still lethargic following circumcision 4 hrs prior.  Baby also had some formula 5 hrs ago.  Encouraged skin to skin, and manual expression.  Mom very good at hand placement and breast support.  Helped her bring baby onto breast quickly.  Baby tucking lower lip, so showed FOB how to gently pull on chin to uncurl lower lip.  Mom states the pain level is 3 and she feels a pinching.  When baby taken off, nipple is rounded and pulled out.  Mom to use EBM on nipple, and then use Comfort Gels.  To call for assistance prn.  Follow up in am.  Judee ClaraSmith, Zaidin Blyden E 08/22/2014, 12:53 PM

## 2014-08-22 NOTE — Progress Notes (Signed)
Post Partum Day 1 Subjective: no complaints  Objective: Blood pressure 101/68, pulse 59, temperature 98.7 F (37.1 C), temperature source Oral, resp. rate 16, height 5\' 3"  (1.6 m), weight 183 lb (83.008 kg), last menstrual period 11/17/2013, SpO2 99 %, unknown if currently breastfeeding.  Physical Exam:  General: alert Lochia: appropriate Uterine Fundus: firm Incision: healing well DVT Evaluation: No evidence of DVT seen on physical exam.   Recent Labs  08/21/14 0225 08/21/14 0530  HGB 12.2 11.3*  HCT 36.4 34.0*    Assessment/Plan: Plan for discharge tomorrow   LOS: 2 days   Elani Delph M 08/22/2014, 7:20 AM

## 2014-08-22 NOTE — Plan of Care (Signed)
Problem: Consults Goal: Postpartum Patient Education (See Patient Education module for education specifics.)  Outcome: Completed/Met Date Met:  08/22/14

## 2014-08-23 MED ORDER — IBUPROFEN 600 MG PO TABS: 600.0000 mg | ORAL_TABLET | Freq: Four times a day (QID) | ORAL | Status: DC

## 2014-08-23 MED ORDER — OXYCODONE-ACETAMINOPHEN 5-325 MG PO TABS: 1.0000 | ORAL_TABLET | ORAL | Status: DC | PRN

## 2014-08-23 NOTE — Discharge Summary (Signed)
Obstetric Discharge Summary Reason for Admission: onset of labor Prenatal Procedures: none Intrapartum Procedures: spontaneous vaginal delivery Postpartum Procedures: none Complications-Operative and Postpartum: none HEMOGLOBIN  Date Value Ref Range Status  08/21/2014 11.3* 12.0 - 15.0 g/dL Final   HCT  Date Value Ref Range Status  08/21/2014 34.0* 36.0 - 46.0 % Final    Physical Exam:  General: alert Lochia: appropriate Uterine Fundus: firm Incision: healing well DVT Evaluation: No evidence of DVT seen on physical exam.  Discharge Diagnoses: Term Pregnancy-delivered  Discharge Information: Date: 08/23/2014 Activity: pelvic rest Diet: routine Medications: PNV, Ibuprofen and Percocet Condition: stable Instructions: refer to practice specific booklet Discharge to: home Follow-up Information    Follow up with Meriel PicaHOLLAND,Karry Causer M, MD. Schedule an appointment as soon as possible for a visit in 6 weeks.   Specialty:  Obstetrics and Gynecology   Contact information:   [address] [place] [id number] [phone]       Newborn Data: Live born female  Birth Weight: 6 lb 6.1 oz (2895 g) APGAR: 8, 9  Home with mother.  Cecille Mcclusky M 08/23/2014, 7:29 AM

## 2014-08-23 NOTE — Lactation Note (Signed)
This note was copied from the chart of Christina Louretta Parmashley Dauphine. Lactation Consultation Note  Patient Name: Christina Louretta Parmashley Brooker [id number]'s Date: 08/23/2014 Reason for consult: Follow-up assessment  Consult Status Consult Status: Complete  Parents have no questions or concerns at this time.   Lurline HareRichey, [place] [facility] 08/23/2014, 10:31 AM

## 2014-08-27 ENCOUNTER — Inpatient Hospital Stay (HOSPITAL_COMMUNITY): Admission: RE | Admit: 2014-08-27 | Payer: 59 | Source: Ambulatory Visit

## 2015-11-15 ENCOUNTER — Encounter: Payer: Self-pay | Admitting: Internal Medicine

## 2015-11-15 ENCOUNTER — Ambulatory Visit (INDEPENDENT_AMBULATORY_CARE_PROVIDER_SITE_OTHER): Payer: 59 | Admitting: Internal Medicine

## 2015-11-15 VITALS — BP 124/78 | HR 76 | Temp 98.2°F | Resp 16 | Ht 63.5 in | Wt 161.0 lb

## 2015-11-15 DIAGNOSIS — Z1389 Encounter for screening for other disorder: Secondary | ICD-10-CM

## 2015-11-15 DIAGNOSIS — Z136 Encounter for screening for cardiovascular disorders: Secondary | ICD-10-CM | POA: Diagnosis not present

## 2015-11-15 DIAGNOSIS — Z0000 Encounter for general adult medical examination without abnormal findings: Secondary | ICD-10-CM | POA: Diagnosis not present

## 2015-11-15 DIAGNOSIS — R011 Cardiac murmur, unspecified: Secondary | ICD-10-CM

## 2015-11-15 DIAGNOSIS — Z13220 Encounter for screening for lipoid disorders: Secondary | ICD-10-CM

## 2015-11-15 DIAGNOSIS — E559 Vitamin D deficiency, unspecified: Secondary | ICD-10-CM

## 2015-11-15 DIAGNOSIS — Z1329 Encounter for screening for other suspected endocrine disorder: Secondary | ICD-10-CM

## 2015-11-15 DIAGNOSIS — Z130 Encounter for screening for diseases of the blood and blood-forming organs and certain disorders involving the immune mechanism: Secondary | ICD-10-CM

## 2015-11-15 DIAGNOSIS — Z131 Encounter for screening for diabetes mellitus: Secondary | ICD-10-CM

## 2015-11-15 DIAGNOSIS — Z79899 Other long term (current) drug therapy: Secondary | ICD-10-CM

## 2015-11-15 LAB — CBC WITH DIFFERENTIAL/PLATELET
Basophils Absolute: 0 10*3/uL (ref 0.0–0.1)
Basophils Relative: 0 % (ref 0–1)
Eosinophils Absolute: 0.1 10*3/uL (ref 0.0–0.7)
Eosinophils Relative: 2 % (ref 0–5)
HEMATOCRIT: 39.8 % (ref 36.0–46.0)
HEMOGLOBIN: 13.6 g/dL (ref 12.0–15.0)
LYMPHS PCT: 22 % (ref 12–46)
Lymphs Abs: 1.4 10*3/uL (ref 0.7–4.0)
MCH: 30.6 pg (ref 26.0–34.0)
MCHC: 34.2 g/dL (ref 30.0–36.0)
MCV: 89.4 fL (ref 78.0–100.0)
MONO ABS: 0.4 10*3/uL (ref 0.1–1.0)
MONOS PCT: 6 % (ref 3–12)
MPV: 8.9 fL (ref 8.6–12.4)
NEUTROS ABS: 4.4 10*3/uL (ref 1.7–7.7)
Neutrophils Relative %: 70 % (ref 43–77)
Platelets: 342 10*3/uL (ref 150–400)
RBC: 4.45 MIL/uL (ref 3.87–5.11)
RDW: 13.2 % (ref 11.5–15.5)
WBC: 6.3 10*3/uL (ref 4.0–10.5)

## 2015-11-15 LAB — URINALYSIS, ROUTINE W REFLEX MICROSCOPIC
Bilirubin Urine: NEGATIVE
Glucose, UA: NEGATIVE
HGB URINE DIPSTICK: NEGATIVE
KETONES UR: NEGATIVE
Leukocytes, UA: NEGATIVE
NITRITE: NEGATIVE
Protein, ur: NEGATIVE
SPECIFIC GRAVITY, URINE: 1.005 (ref 1.001–1.035)
pH: 7 (ref 5.0–8.0)

## 2015-11-15 NOTE — Patient Instructions (Signed)
Preventive Care for Adults A healthy lifestyle and preventive care can promote health and wellness. Preventive health guidelines for women include the following key practices.  A routine yearly physical is a good way to check with your health care provider about your health and preventive screening. It is a chance to share any concerns and updates on your health and to receive a thorough exam.  Visit your dentist for a routine exam and preventive care every 6 months. Brush your teeth twice a day and floss once a day. Good oral hygiene prevents tooth decay and gum disease.  The frequency of eye exams is based on your age, health, family medical history, use of contact lenses, and other factors. Follow your health care provider's recommendations for frequency of eye exams.  Eat a healthy diet. Foods like vegetables, fruits, whole grains, low-fat dairy products, and lean protein foods contain the nutrients you need without too many calories. Decrease your intake of foods high in solid fats, added sugars, and salt. Eat the right amount of calories for you.Get information about a proper diet from your health care provider, if necessary.  Regular physical exercise is one of the most important things you can do for your health. Most adults should get at least 150 minutes of moderate-intensity exercise (any activity that increases your heart rate and causes you to sweat) each week. In addition, most adults need muscle-strengthening exercises on 2 or more days a week.  Maintain a healthy weight. The body mass index (BMI) is a screening tool to identify possible weight problems. It provides an estimate of body fat based on height and weight. Your health care provider can find your BMI and can help you achieve or maintain a healthy weight.For adults 20 years and older:  A BMI below 18.5 is considered underweight.  A BMI of 18.5 to 24.9 is normal.  A BMI of 25 to 29.9 is considered overweight.  A BMI of  30 and above is considered obese.  Maintain normal blood lipids and cholesterol levels by exercising and minimizing your intake of saturated fat. Eat a balanced diet with plenty of fruit and vegetables. Blood tests for lipids and cholesterol should begin at age 20 and be repeated every 5 years. If your lipid or cholesterol levels are high, you are over 50, or you are at high risk for heart disease, you may need your cholesterol levels checked more frequently.Ongoing high lipid and cholesterol levels should be treated with medicines if diet and exercise are not working.  If you smoke, find out from your health care provider how to quit. If you do not use tobacco, do not start.  Lung cancer screening is recommended for adults aged 55-80 years who are at high risk for developing lung cancer because of a history of smoking. A yearly low-dose CT scan of the lungs is recommended for people who have at least a 30-pack-year history of smoking and are a current smoker or have quit within the past 15 years. A pack year of smoking is smoking an average of 1 pack of cigarettes a day for 1 year (for example: 1 pack a day for 30 years or 2 packs a day for 15 years). Yearly screening should continue until the smoker has stopped smoking for at least 15 years. Yearly screening should be stopped for people who develop a health problem that would prevent them from having lung cancer treatment.  If you are pregnant, do not drink alcohol. If you are breastfeeding,   breastfeeding, be very cautious about drinking alcohol. If you are not pregnant and choose to drink alcohol, do not have more than 1 drink per day. One drink is considered to be 12 ounces (355 mL) of beer, 5 ounces (148 mL) of wine, or 1.5 ounces (44 mL) of liquor.  Avoid use of street drugs. Do not share needles with anyone. Ask for help if you need support or instructions about stopping the use of drugs.  High blood pressure causes heart disease and increases the risk of  stroke. Your blood pressure should be checked at least every 1 to 2 years. Ongoing high blood pressure should be treated with medicines if weight loss and exercise do not work.  If you are 3-31 years old, ask your health care provider if you should take aspirin to prevent strokes.  Diabetes screening involves taking a blood sample to check your fasting blood sugar level. This should be done once every 3 years, after age 31, if you are within normal weight and without risk factors for diabetes. Testing should be considered at a younger age or be carried out more frequently if you are overweight and have at least 1 risk factor for diabetes.  Breast cancer screening is essential preventive care for women. You should practice "breast self-awareness." This means understanding the normal appearance and feel of your breasts and may include breast self-examination. Any changes detected, no matter how small, should be reported to a health care provider. Women in their 76s and 30s should have a clinical breast exam (CBE) by a health care provider as part of a regular health exam every 1 to 3 years. After age 65, women should have a CBE every year. Starting at age 67, women should consider having a mammogram (breast X-ray test) every year. Women who have a family history of breast cancer should talk to their health care provider about genetic screening. Women at a high risk of breast cancer should talk to their health care providers about having an MRI and a mammogram every year.  Breast cancer gene (BRCA)-related cancer risk assessment is recommended for women who have family members with BRCA-related cancers. BRCA-related cancers include breast, ovarian, tubal, and peritoneal cancers. Having family members with these cancers may be associated with an increased risk for harmful changes (mutations) in the breast cancer genes BRCA1 and BRCA2. Results of the assessment will determine the need for genetic counseling and  BRCA1 and BRCA2 testing.  Routine pelvic exams to screen for cancer are no longer recommended for nonpregnant women who are considered low risk for cancer of the pelvic organs (ovaries, uterus, and vagina) and who do not have symptoms. Ask your health care provider if a screening pelvic exam is right for you.  If you have had past treatment for cervical cancer or a condition that could lead to cancer, you need Pap tests and screening for cancer for at least 20 years after your treatment. If Pap tests have been discontinued, your risk factors (such as having a new sexual partner) need to be reassessed to determine if screening should be resumed. Some women have medical problems that increase the chance of getting cervical cancer. In these cases, your health care provider may recommend more frequent screening and Pap tests.  The HPV test is an additional test that may be used for cervical cancer screening. The HPV test looks for the virus that can cause the cell changes on the cervix. The cells collected during the Pap test can  be tested for HPV. The HPV test could be used to screen women aged 98 years and older, and should be used in women of any age who have unclear Pap test results. After the age of 59, women should have HPV testing at the same frequency as a Pap test.  Colorectal cancer can be detected and often prevented. Most routine colorectal cancer screening begins at the age of 35 years and continues through age 61 years. However, your health care provider may recommend screening at an earlier age if you have risk factors for colon cancer. On a yearly basis, your health care provider may provide home test kits to check for hidden blood in the stool. Use of a small camera at the end of a tube, to directly examine the colon (sigmoidoscopy or colonoscopy), can detect the earliest forms of colorectal cancer. Talk to your health care provider about this at age 18, when routine screening begins. Direct  exam of the colon should be repeated every 5-10 years through age 67 years, unless early forms of pre-cancerous polyps or small growths are found.  People who are at an increased risk for hepatitis B should be screened for this virus. You are considered at high risk for hepatitis B if:  You were born in a country where hepatitis B occurs often. Talk with your health care provider about which countries are considered high risk.  Your parents were born in a high-risk country and you have not received a shot to protect against hepatitis B (hepatitis B vaccine).  You have HIV or AIDS.  You use needles to inject street drugs.  You live with, or have sex with, someone who has hepatitis B.  You get hemodialysis treatment.  You take certain medicines for conditions like cancer, organ transplantation, and autoimmune conditions.  Hepatitis C blood testing is recommended for all people born from [birth year] through 1965 and any individual with known risks for hepatitis C.  Practice safe sex. Use condoms and avoid high-risk sexual practices to reduce the spread of sexually transmitted infections (STIs). STIs include gonorrhea, chlamydia, syphilis, trichomonas, herpes, HPV, and human immunodeficiency virus (HIV). Herpes, HIV, and HPV are viral illnesses that have no cure. They can result in disability, cancer, and death.  You should be screened for sexually transmitted illnesses (STIs) including gonorrhea and chlamydia if:  You are sexually active and are younger than 24 years.  You are older than 24 years and your health care provider tells you that you are at risk for this type of infection.  Your sexual activity has changed since you were last screened and you are at an increased risk for chlamydia or gonorrhea. Ask your health care provider if you are at risk.  If you are at risk of being infected with HIV, it is recommended that you take a prescription medicine daily to prevent HIV infection. This is  called preexposure prophylaxis (PrEP). You are considered at risk if:  You are a heterosexual woman, are sexually active, and are at increased risk for HIV infection.  You take drugs by injection.  You are sexually active with a partner who has HIV.  Talk with your health care provider about whether you are at high risk of being infected with HIV. If you choose to begin PrEP, you should first be tested for HIV. You should then be tested every 3 months for as long as you are taking PrEP.  Osteoporosis is a disease in which the bones lose minerals and  strength with aging. This can result in serious bone fractures or breaks. The risk of osteoporosis can be identified using a bone density scan. Women ages 48 years and over and women at risk for fractures or osteoporosis should discuss screening with their health care providers. Ask your health care provider whether you should take a calcium supplement or vitamin D to reduce the rate of osteoporosis.  Menopause can be associated with physical symptoms and risks. Hormone replacement therapy is available to decrease symptoms and risks. You should talk to your health care provider about whether hormone replacement therapy is right for you.  Use sunscreen. Apply sunscreen liberally and repeatedly throughout the day. You should seek shade when your shadow is shorter than you. Protect yourself by wearing long sleeves, pants, a wide-brimmed hat, and sunglasses year round, whenever you are outdoors.  Once a month, do a whole body skin exam, using a mirror to look at the skin on your back. Tell your health care provider of new moles, moles that have irregular borders, moles that are larger than a pencil eraser, or moles that have changed in shape or color.  Stay current with required vaccines (immunizations).  Influenza vaccine. All adults should be immunized every year.  Tetanus, diphtheria, and acellular pertussis (Td, Tdap) vaccine. Pregnant women should  receive 1 dose of Tdap vaccine during each pregnancy. The dose should be obtained regardless of the length of time since the last dose. Immunization is preferred during the 27th-36th week of gestation. An adult who has not previously received Tdap or who does not know her vaccine status should receive 1 dose of Tdap. This initial dose should be followed by tetanus and diphtheria toxoids (Td) booster doses every 10 years. Adults with an unknown or incomplete history of completing a 3-dose immunization series with Td-containing vaccines should begin or complete a primary immunization series including a Tdap dose. Adults should receive a Td booster every 10 years.  Varicella vaccine. An adult without evidence of immunity to varicella should receive 2 doses or a second dose if she has previously received 1 dose. Pregnant females who do not have evidence of immunity should receive the first dose after pregnancy. This first dose should be obtained before leaving the health care facility. The second dose should be obtained 4-8 weeks after the first dose.  Human papillomavirus (HPV) vaccine. Females aged 13-26 years who have not received the vaccine previously should obtain the 3-dose series. The vaccine is not recommended for use in pregnant females. However, pregnancy testing is not needed before receiving a dose. If a female is found to be pregnant after receiving a dose, no treatment is needed. In that case, the remaining doses should be delayed until after the pregnancy. Immunization is recommended for any person with an immunocompromised condition through the age of 48 years if she did not get any or all doses earlier. During the 3-dose series, the second dose should be obtained 4-8 weeks after the first dose. The third dose should be obtained 24 weeks after the first dose and 16 weeks after the second dose.  Zoster vaccine. One dose is recommended for adults aged 67 years or older unless certain conditions are  present.  Measles, mumps, and rubella (MMR) vaccine. Adults born before [birth year] generally are considered immune to measles and mumps. Adults born in [birth year] or later should have 1 or more doses of MMR vaccine unless there is a contraindication to the vaccine or there is laboratory evidence of immunity  to each of the three diseases. A routine second dose of MMR vaccine should be obtained at least 28 days after the first dose for students attending postsecondary schools, health care workers, or international travelers. People who received inactivated measles vaccine or an unknown type of measles vaccine during 1963-1967 should receive 2 doses of MMR vaccine. People who received inactivated mumps vaccine or an unknown type of mumps vaccine before 1979 and are at high risk for mumps infection should consider immunization with 2 doses of MMR vaccine. For females of childbearing age, rubella immunity should be determined. If there is no evidence of immunity, females who are not pregnant should be vaccinated. If there is no evidence of immunity, females who are pregnant should delay immunization until after pregnancy. Unvaccinated health care workers born before [birth year] who lack laboratory evidence of measles, mumps, or rubella immunity or laboratory confirmation of disease should consider measles and mumps immunization with 2 doses of MMR vaccine or rubella immunization with 1 dose of MMR vaccine.  Pneumococcal 13-valent conjugate (PCV13) vaccine. When indicated, a person who is uncertain of her immunization history and has no record of immunization should receive the PCV13 vaccine. An adult aged 58 years or older who has certain medical conditions and has not been previously immunized should receive 1 dose of PCV13 vaccine. This PCV13 should be followed with a dose of pneumococcal polysaccharide (PPSV23) vaccine. The PPSV23 vaccine dose should be obtained at least 8 weeks after the dose of PCV13 vaccine. An adult aged 65  years or older who has certain medical conditions and previously received 1 or more doses of PPSV23 vaccine should receive 1 dose of PCV13. The PCV13 vaccine dose should be obtained 1 or more years after the last PPSV23 vaccine dose.  Pneumococcal polysaccharide (PPSV23) vaccine. When PCV13 is also indicated, PCV13 should be obtained first. All adults aged 18 years and older should be immunized. An adult younger than age 74 years who has certain medical conditions should be immunized. Any person who resides in a nursing home or long-term care facility should be immunized. An adult smoker should be immunized. People with an immunocompromised condition and certain other conditions should receive both PCV13 and PPSV23 vaccines. People with human immunodeficiency virus (HIV) infection should be immunized as soon as possible after diagnosis. Immunization during chemotherapy or radiation therapy should be avoided. Routine use of PPSV23 vaccine is not recommended for American Indians, [place] Natives, or people younger than 65 years unless there are medical conditions that require PPSV23 vaccine. When indicated, people who have unknown immunization and have no record of immunization should receive PPSV23 vaccine. One-time revaccination 5 years after the first dose of PPSV23 is recommended for people aged 19-64 years who have chronic kidney failure, nephrotic syndrome, asplenia, or immunocompromised conditions. People who received 1-2 doses of PPSV23 before age 21 years should receive another dose of PPSV23 vaccine at age 42 years or later if at least 5 years have passed since the previous dose. Doses of PPSV23 are not needed for people immunized with PPSV23 at or after age 61 years.  Meningococcal vaccine. Adults with asplenia or persistent complement component deficiencies should receive 2 doses of quadrivalent meningococcal conjugate (MenACWY-D) vaccine. The doses should be obtained at least 2 months apart.  Microbiologists working with certain meningococcal bacteria, [employer], people at risk during an outbreak, and people who travel to or live in countries with a high rate of meningitis should be immunized. A first-year college student up through  age 23 years who is living in a residence hall should receive a dose if she did not receive a dose on or after her 16th birthday. Adults who have certain high-risk conditions should receive one or more doses of vaccine.  Hepatitis A vaccine. Adults who wish to be protected from this disease, have certain high-risk conditions, work with hepatitis A-infected animals, work in hepatitis A research labs, or travel to or work in countries with a high rate of hepatitis A should be immunized. Adults who were previously unvaccinated and who anticipate close contact with an international adoptee during the first 60 days after arrival in the [place] from a country with a high rate of hepatitis A should be immunized.  Hepatitis B vaccine. Adults who wish to be protected from this disease, have certain high-risk conditions, may be exposed to blood or other infectious body fluids, are household contacts or sex partners of hepatitis B positive people, are clients or workers in certain care facilities, or travel to or work in countries with a high rate of hepatitis B should be immunized.  Haemophilus influenzae type b (Hib) vaccine. A previously unvaccinated person with asplenia or sickle cell disease or having a scheduled splenectomy should receive 1 dose of Hib vaccine. Regardless of previous immunization, a recipient of a hematopoietic stem cell transplant should receive a 3-dose series 6-12 months after her successful transplant. Hib vaccine is not recommended for adults with HIV infection. Preventive Services / Frequency  Ages 65 to 22 years  Blood pressure check.  Lipid and cholesterol check.  Clinical breast exam.** / Every 3 years for women in their 53s  and 35s.  BRCA-related cancer risk assessment.** / For women who have family members with a BRCA-related cancer (breast, ovarian, tubal, or peritoneal cancers).  Pap test.** / Every 2 years from ages 41 through 44. Every 3 years starting at age 64 through age 28 or 21 with a history of 3 consecutive normal Pap tests.  HPV screening.** / Every 3 years from ages 9 through ages 56 to 48 with a history of 3 consecutive normal Pap tests.  Hepatitis C blood test.** / For any individual with known risks for hepatitis C.  Skin self-exam. / Monthly.  Influenza vaccine. / Every year.  Tetanus, diphtheria, and acellular pertussis (Tdap, Td) vaccine.** / Consult your health care provider. Pregnant women should receive 1 dose of Tdap vaccine during each pregnancy. 1 dose of Td every 10 years.  Varicella vaccine.** / Consult your health care provider. Pregnant females who do not have evidence of immunity should receive the first dose after pregnancy.  HPV vaccine. / 3 doses over 6 months, if 67 and younger. The vaccine is not recommended for use in pregnant females. However, pregnancy testing is not needed before receiving a dose.  Measles, mumps, rubella (MMR) vaccine.** / You need at least 1 dose of MMR if you were born in 1957 or later. You may also need a 2nd dose. For females of childbearing age, rubella immunity should be determined. If there is no evidence of immunity, females who are not pregnant should be vaccinated. If there is no evidence of immunity, females who are pregnant should delay immunization until after pregnancy.  Pneumococcal 13-valent conjugate (PCV13) vaccine.** / Consult your health care provider.  Pneumococcal polysaccharide (PPSV23) vaccine.** / 1 to 2 doses if you smoke cigarettes or if you have certain conditions.  Meningococcal vaccine.** / 1 dose if you are age 61 to 21 years and  a Market researcher living in a residence hall, or have one of several medical  conditions, you need to get vaccinated against meningococcal disease. You may also need additional booster doses.  Hepatitis A vaccine.** / Consult your health care provider.  Hepatitis B vaccine.** / Consult your health care provider.  Haemophilus influenzae type b (Hib) vaccine.** / Consult your health care provider.

## 2015-11-15 NOTE — Progress Notes (Signed)
Patient ID: Christina Montes, female   DOB: 10-09-1980, 35 y.o.   MRN: [id number]    Annual Screening and NEW Patient Comprehensive Examination   This very nice 35 y.o.female [id number] who presents for complete physical and establishment as a new patient.  She most recently delivered her last baby in 12/15.  Patient has no major health issues.  Patient reports no complaints at this time.   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  She does get to work from home.  She has 2 kids.  She does have family here.    She does have a history of right sided kidney dysfunction.  She does also have a history of kidney stones.    She is seeing Dr. Vickey Sages.  She sees her yearly.  She did have 1 abnormal pap smear and has had a colposcopy.  She is taking lo loestrin, and periods are nearly non-existant.  She is doing self breast exams at home.  Never felt anything abnormal.    Finally, patient has history of Vitamin D Deficiency and last vitamin D was No results found for: VD25OH.  Currently on supplementation  She does walk in the neighborhood several times a week and plays with her children.      No current outpatient prescriptions on file prior to visit.   No current facility-administered medications on file prior to visit.    No Known Allergies  Past Medical History  Diagnosis Date  . Chronic kidney disease     1 functioning kidney, hx stones  . Kidney stones     There is no immunization history for the selected administration types on file for this patient.  Past Surgical History  Procedure Laterality Date  . Lithotripsy    . Surgical removal of kidney stones    . Wisdom tooth extraction      No family history on file.  Social History   Social History  . Marital Status: Married    Spouse Name: N/A  . Number of Children: N/A  . Years of Education: N/A   Occupational History  . Not on file.   Social History Main Topics  . Smoking status: Never Smoker   .  Smokeless tobacco: Never Used  . Alcohol Use: No  . Drug Use: No  . Sexual Activity: Yes   Other Topics Concern  . Not on file   Social History Narrative    Review of Systems  Constitutional: Negative for fever, chills and malaise/fatigue.  HENT: Negative for congestion, ear pain and sore throat.   Eyes: Negative.   Respiratory: Negative for cough, shortness of breath and wheezing.   Cardiovascular: Positive for palpitations (occasional). Negative for chest pain and leg swelling.  Gastrointestinal: Negative for heartburn, nausea, vomiting, diarrhea, constipation, blood in stool and melena.  Genitourinary: Negative.   Skin: Negative.   Neurological: Negative for dizziness, sensory change, loss of consciousness and headaches.  Psychiatric/Behavioral: Negative for depression. The patient is not nervous/anxious and does not have insomnia.       Physical Exam  BP 124/78 mmHg  Pulse 76  Temp(Src) 98.2 F (36.8 C) (Temporal)  Resp 16  Ht 5' 3.5" (1.613 m)  Wt 161 lb (73.029 kg)  BMI 28.07 kg/m2  General Appearance: Well nourished and in no apparent distress. Eyes: PERRLA, EOMs, conjunctiva no swelling or erythema, normal fundi and vessels. Sinuses: No frontal/maxillary tenderness ENT/Mouth: EACs patent / TMs  nl. Nares clear without erythema, swelling, mucoid  exudates. Oral hygiene is good. No erythema, swelling, or exudate. Tongue normal, non-obstructing. Tonsils not swollen or erythematous. Hearing normal.  Neck: Supple, thyroid normal. No bruits, nodes or JVD. Respiratory: Respiratory effort normal.  BS equal and clear bilateral without rales, rhonci, wheezing or stridor. Cardio: 2/6 murmur heard best on left sternal border regular rate and rhythm and no murmurs, rubs or gallops. Peripheral pulses are normal and equal bilaterally without edema. No aortic or femoral bruits. Chest: symmetric with normal excursions and percussion.  Abdomen: Flat, soft, with bowl sounds.  Nontender, no guarding, rebound, hernias, masses, or organomegaly.  Lymphatics: Non tender without lymphadenopathy.  Musculoskeletal: Full ROM all peripheral extremities, joint stability, 5/5 strength, and normal gait. Skin: Warm and dry without rashes, lesions, cyanosis, clubbing or  ecchymosis.  Neuro: Cranial nerves intact, reflexes equal bilaterally. Normal muscle tone, no cerebellar symptoms. Sensation intact.  Pysch: Awake and oriented X 3, normal affect, Insight and Judgment appropriate.   Assessment and Plan   1. Routine general medical examination at a health care facility   2. Screening for diabetes mellitus  - Hemoglobin A1c - Insulin, random  3. Screening for hyperlipidemia  - Lipid panel  4. Screening for thyroid disorder  - TSH  5. Screening for cardiovascular condition  - EKG 12-Lead  6. Screening for hematuria or proteinuria  - Urinalysis, Routine w reflex microscopic (not at [facility]) - Microalbumin / creatinine urine ratio  7. Screening, deficiency anemia, iron  - Iron and TIBC - Vitamin B12  8. Medication management  - CBC with Differential/Platelet - BASIC METABOLIC PANEL WITH GFR - Hepatic function panel - Magnesium  9. Vitamin D deficiency  - VITAMIN D 25 Hydroxy (Vit-D Deficiency, Fractures)      Continue prudent diet as discussed, weight control, regular exercise, and medications. Routine screening labs and tests as requested with regular follow-up as recommended.  Over 40 minutes of exam, counseling, chart review and critical decision making was performed

## 2015-11-16 LAB — LIPID PANEL
CHOL/HDL RATIO: 3 ratio (ref <=5.0)
CHOLESTEROL: 146 mg/dL (ref 125–200)
HDL: 48 mg/dL (ref >=46)
LDL CALC: 78 mg/dL (ref <130)
Triglycerides: 100 mg/dL (ref <150)
VLDL: 20 mg/dL (ref <30)

## 2015-11-16 LAB — HEMOGLOBIN A1C
Hgb A1c MFr Bld: 5.4 % (ref <5.7)
Mean Plasma Glucose: 108 mg/dL (ref <117)

## 2015-11-16 LAB — BASIC METABOLIC PANEL WITH GFR
BUN: 13 mg/dL (ref 7–25)
CALCIUM: 8.8 mg/dL (ref 8.6–10.2)
CO2: 25 mmol/L (ref 20–31)
CREATININE: 0.81 mg/dL (ref 0.50–1.10)
Chloride: 103 mmol/L (ref 98–110)
GFR, Est African American: >89 mL/min (ref >=60)
GFR, Est Non African American: >89 mL/min (ref >=60)
GLUCOSE: 91 mg/dL (ref 65–99)
Potassium: 4 mmol/L (ref 3.5–5.3)
Sodium: 137 mmol/L (ref 135–146)

## 2015-11-16 LAB — HEPATIC FUNCTION PANEL
ALK PHOS: 45 U/L (ref 33–115)
ALT: 10 U/L (ref 6–29)
AST: 12 U/L (ref 10–30)
Albumin: 3.9 g/dL (ref 3.6–5.1)
BILIRUBIN DIRECT: 0.1 mg/dL (ref <=0.2)
Indirect Bilirubin: 0.3 mg/dL (ref 0.2–1.2)
Total Bilirubin: 0.4 mg/dL (ref 0.2–1.2)
Total Protein: 6.7 g/dL (ref 6.1–8.1)

## 2015-11-16 LAB — TSH: TSH: 1.39 mIU/L

## 2015-11-16 LAB — INSULIN, RANDOM: Insulin: 4.4 u[IU]/mL (ref 2.0–19.6)

## 2015-11-16 LAB — MICROALBUMIN / CREATININE URINE RATIO: CREATININE, URINE: 28 mg/dL (ref 20–320)

## 2015-11-16 LAB — IRON AND TIBC
%SAT: 20 % (ref 11–50)
Iron: 75 ug/dL (ref 40–190)
TIBC: 369 ug/dL (ref 250–450)
UIBC: 294 ug/dL (ref 125–400)

## 2015-11-16 LAB — VITAMIN B12: Vitamin B-12: 329 pg/mL (ref 200–1100)

## 2015-11-16 LAB — MAGNESIUM: Magnesium: 1.9 mg/dL (ref 1.5–2.5)

## 2015-11-16 LAB — VITAMIN D 25 HYDROXY (VIT D DEFICIENCY, FRACTURES): Vit D, 25-Hydroxy: 22 ng/mL — ABNORMAL LOW (ref 30–100)

## 2016-11-14 ENCOUNTER — Encounter: Payer: Self-pay | Admitting: Internal Medicine

## 2016-11-20 ENCOUNTER — Ambulatory Visit (INDEPENDENT_AMBULATORY_CARE_PROVIDER_SITE_OTHER): Payer: 59 | Admitting: Internal Medicine

## 2016-11-20 ENCOUNTER — Encounter: Payer: Self-pay | Admitting: Internal Medicine

## 2016-11-20 VITALS — BP 102/70 | HR 82 | Temp 97.9°F | Resp 17 | Wt 149.0 lb

## 2016-11-20 DIAGNOSIS — Z1389 Encounter for screening for other disorder: Secondary | ICD-10-CM

## 2016-11-20 DIAGNOSIS — Z808 Family history of malignant neoplasm of other organs or systems: Secondary | ICD-10-CM

## 2016-11-20 DIAGNOSIS — E559 Vitamin D deficiency, unspecified: Secondary | ICD-10-CM

## 2016-11-20 DIAGNOSIS — Z13220 Encounter for screening for lipoid disorders: Secondary | ICD-10-CM

## 2016-11-20 DIAGNOSIS — Z0000 Encounter for general adult medical examination without abnormal findings: Secondary | ICD-10-CM | POA: Diagnosis not present

## 2016-11-20 DIAGNOSIS — Z1329 Encounter for screening for other suspected endocrine disorder: Secondary | ICD-10-CM

## 2016-11-20 DIAGNOSIS — Z79899 Other long term (current) drug therapy: Secondary | ICD-10-CM

## 2016-11-20 DIAGNOSIS — Z131 Encounter for screening for diabetes mellitus: Secondary | ICD-10-CM

## 2016-11-20 DIAGNOSIS — Z130 Encounter for screening for diseases of the blood and blood-forming organs and certain disorders involving the immune mechanism: Secondary | ICD-10-CM

## 2016-11-20 LAB — CBC WITH DIFFERENTIAL/PLATELET
Basophils Absolute: 0 cells/uL (ref 0–200)
Basophils Relative: 0 %
EOS ABS: 156 {cells}/uL (ref 15–500)
Eosinophils Relative: 3 %
HCT: 41.5 % (ref 35.0–45.0)
Hemoglobin: 13.5 g/dL (ref 11.7–15.5)
Lymphocytes Relative: 24 %
Lymphs Abs: 1248 cells/uL (ref 850–3900)
MCH: 29.6 pg (ref 27.0–33.0)
MCHC: 32.5 g/dL (ref 32.0–36.0)
MCV: 91 fL (ref 80.0–100.0)
MPV: 9 fL (ref 7.5–12.5)
Monocytes Absolute: 416 cells/uL (ref 200–950)
Monocytes Relative: 8 %
NEUTROS ABS: 3380 {cells}/uL (ref 1500–7800)
Neutrophils Relative %: 65 %
Platelets: 362 10*3/uL (ref 140–400)
RBC: 4.56 MIL/uL (ref 3.80–5.10)
RDW: 12.7 % (ref 11.0–15.0)
WBC: 5.2 10*3/uL (ref 3.8–10.8)

## 2016-11-20 LAB — BASIC METABOLIC PANEL WITH GFR
BUN: 13 mg/dL (ref 7–25)
CALCIUM: 9.2 mg/dL (ref 8.6–10.2)
CHLORIDE: 102 mmol/L (ref 98–110)
CO2: 27 mmol/L (ref 20–31)
Creat: 0.84 mg/dL (ref 0.50–1.10)
GFR, Est African American: >89 mL/min (ref >=60)
GFR, Est Non African American: >89 mL/min (ref >=60)
GLUCOSE: 72 mg/dL (ref 65–99)
Potassium: 4.1 mmol/L (ref 3.5–5.3)
Sodium: 137 mmol/L (ref 135–146)

## 2016-11-20 LAB — LIPID PANEL
CHOL/HDL RATIO: 2.4 ratio (ref <5.0)
Cholesterol: 150 mg/dL (ref <200)
HDL: 62 mg/dL (ref >50)
LDL Cholesterol: 75 mg/dL (ref <100)
Triglycerides: 64 mg/dL (ref <150)
VLDL: 13 mg/dL (ref <30)

## 2016-11-20 LAB — HEPATIC FUNCTION PANEL
ALT: 12 U/L (ref 6–29)
AST: 14 U/L (ref 10–30)
Albumin: 4.1 g/dL (ref 3.6–5.1)
Alkaline Phosphatase: 42 U/L (ref 33–115)
Bilirubin, Direct: 0.1 mg/dL (ref <=0.2)
Indirect Bilirubin: 0.5 mg/dL (ref 0.2–1.2)
TOTAL PROTEIN: 6.7 g/dL (ref 6.1–8.1)
Total Bilirubin: 0.6 mg/dL (ref 0.2–1.2)

## 2016-11-20 LAB — IRON AND TIBC
%SAT: 28 % (ref 11–50)
Iron: 106 ug/dL (ref 40–190)
TIBC: 381 ug/dL (ref 250–450)
UIBC: 275 ug/dL (ref 125–400)

## 2016-11-20 LAB — HEMOGLOBIN A1C
Hgb A1c MFr Bld: 4.9 % (ref <5.7)
Mean Plasma Glucose: 94 mg/dL

## 2016-11-20 LAB — VITAMIN B12: Vitamin B-12: 481 pg/mL (ref 200–1100)

## 2016-11-20 LAB — TSH: TSH: 1.31 mIU/L

## 2016-11-20 NOTE — Progress Notes (Signed)
Annual Screening Comprehensive Examination   This very nice 36 y.o.female presents for complete physical.  Patient has no major health issues.  Patient reports no complaints at this time.   She is seeing obygn.  She has had normal pap smears.  Periods are normal.  She is doing well with her birth control pill.  No bleeding or spotting in between periods.    She does some walking here and there.  She just got a new exercise machine.    She would like to have a referral to dermatology for a yearly skin survey.  Her mother has a history of melanoma and she would be more comfortable getting a yearly survey.  Finally, patient has history of Vitamin D Deficiency and last vitamin D was  Lab Results  Component Value Date   VD25OH 22 (L) 11/15/2015  .  Currently on supplementation     Current Outpatient Prescriptions on File Prior to Visit  Medication Sig Dispense Refill  . Norethin-Eth Estrad-Fe Biphas (LO LOESTRIN FE PO) Take by mouth daily.     No current facility-administered medications on file prior to visit.     No Known Allergies  Past Medical History:  Diagnosis Date  . Anemia   . Chronic kidney disease    1 functioning kidney, hx stones  . Heart murmur   . Kidney stones     There is no immunization history for the selected administration types on file for this patient.  Past Surgical History:  Procedure Laterality Date  . LITHOTRIPSY    . surgical removal of kidney stones    . WISDOM TOOTH EXTRACTION      Family History  Problem Relation Age of Onset  . Heart disease Father   . Hypertension Maternal Grandmother   . Heart disease Maternal Grandmother     Social History   Social History  . Marital status: Married    Spouse name: N/A  . Number of children: N/A  . Years of education: N/A   Occupational History  . Not on file.   Social History Main Topics  . Smoking status: Never Smoker  . Smokeless tobacco: Never Used  . Alcohol use 0.6 oz/week    1  Glasses of wine per week  . Drug use: No  . Sexual activity: Yes    Birth control/ protection: Pill   Other Topics Concern  . Not on file   Social History Narrative  . No narrative on file   Review of Systems  Constitutional: Negative for chills, fever and malaise/fatigue.  HENT: Negative for congestion, ear pain and sore throat.   Eyes: Negative.   Respiratory: Negative for cough, shortness of breath and wheezing.   Cardiovascular: Negative for chest pain, palpitations and leg swelling.  Gastrointestinal: Negative for abdominal pain, blood in stool, constipation, diarrhea, heartburn and melena.  Genitourinary: Negative.   Skin: Negative.   Neurological: Negative for dizziness, sensory change, loss of consciousness and headaches.  Psychiatric/Behavioral: Negative for depression. The patient is not nervous/anxious and does not have insomnia.       Physical Exam  BP 102/70   Pulse 82   Temp 97.9 F (36.6 C)   Resp 17   Wt 149 lb (67.6 kg)   SpO2 99%   BMI 25.98 kg/m   General Appearance: Well nourished and in no apparent distress. Eyes: PERRLA, EOMs, conjunctiva no swelling or erythema, normal fundi and vessels. Sinuses: No frontal/maxillary tenderness ENT/Mouth: EACs patent / TMs  nl.  Nares clear without erythema, swelling, mucoid exudates. Oral hygiene is good. No erythema, swelling, or exudate. Tongue normal, non-obstructing. Tonsils not swollen or erythematous. Hearing normal.  Neck: Supple, thyroid normal. No bruits, nodes or JVD. Respiratory: Respiratory effort normal.  BS equal and clear bilateral without rales, rhonci, wheezing or stridor. Cardio: Heart sounds are normal with regular rate and rhythm and no murmurs, rubs or gallops. Peripheral pulses are normal and equal bilaterally without edema. No aortic or femoral bruits. Chest: symmetric with normal excursions and percussion.  Abdomen: Flat, soft, with bowl sounds. Nontender, no guarding, rebound, hernias,  masses, or organomegaly.  Lymphatics: Non tender without lymphadenopathy.  Musculoskeletal: Full ROM all peripheral extremities, joint stability, 5/5 strength, and normal gait. Skin: Warm and dry without rashes, lesions, cyanosis, clubbing or  ecchymosis.  Neuro: Cranial nerves intact, reflexes equal bilaterally. Normal muscle tone, no cerebellar symptoms. Sensation intact.  Pysch: Awake and oriented X 3, normal affect, Insight and Judgment appropriate.   Assessment and Plan   1. Routine general medical examination at a health care facility   2. Screening for hyperlipidemia  - Lipid panel  3. Screening for diabetes mellitus  - Hemoglobin A1c - Insulin, random  4. Screening for deficiency anemia  - Iron and TIBC - Vitamin B12  5. Screening for hematuria or proteinuria  - Urinalysis, Routine w reflex microscopic - Microalbumin / creatinine urine ratio  6. Vitamin D deficiency -cont Vit D supplement - VITAMIN D 25 Hydroxy (Vit-D Deficiency, Fractures)  7. Screening for thyroid disorder - TSH  8. Medication management  - CBC with Differential/Platelet - BASIC METABOLIC PANEL WITH GFR - Hepatic function panel - Magnesium      Continue prudent diet as discussed, weight control, regular exercise, and medications. Routine screening labs and tests as requested with regular follow-up as recommended.  Over 40 minutes of exam, counseling, chart review and critical decision making was performed

## 2016-11-21 LAB — URINALYSIS, MICROSCOPIC ONLY
BACTERIA UA: NONE SEEN [HPF]
Casts: NONE SEEN [LPF]
Crystals: NONE SEEN [HPF]
RBC / HPF: NONE SEEN RBC/HPF (ref <=2)
Yeast: NONE SEEN [HPF]

## 2016-11-21 LAB — URINALYSIS, ROUTINE W REFLEX MICROSCOPIC
BILIRUBIN URINE: NEGATIVE
Glucose, UA: NEGATIVE
Hgb urine dipstick: NEGATIVE
Ketones, ur: NEGATIVE
Nitrite: NEGATIVE
Protein, ur: NEGATIVE
Specific Gravity, Urine: 1.014 (ref 1.001–1.035)
pH: 7.5 (ref 5.0–8.0)

## 2016-11-21 LAB — MICROALBUMIN / CREATININE URINE RATIO
Creatinine, Urine: 64 mg/dL (ref 20–320)
MICROALB UR: 0.3 mg/dL
Microalb Creat Ratio: 5 mcg/mg creat (ref <30)

## 2016-11-21 LAB — VITAMIN D 25 HYDROXY (VIT D DEFICIENCY, FRACTURES): VIT D 25 HYDROXY: 23 ng/mL — AB (ref 30–100)

## 2016-11-21 LAB — MAGNESIUM: Magnesium: 2 mg/dL (ref 1.5–2.5)

## 2016-11-21 LAB — INSULIN, RANDOM: Insulin: 3.4 u[IU]/mL (ref 2.0–19.6)

## 2016-11-23 ENCOUNTER — Encounter: Payer: Self-pay | Admitting: *Deleted

## 2017-02-07 ENCOUNTER — Encounter: Payer: Self-pay | Admitting: *Deleted

## 2017-02-21 ENCOUNTER — Encounter: Payer: Self-pay | Admitting: *Deleted

## 2017-11-19 ENCOUNTER — Encounter: Payer: Self-pay | Admitting: Internal Medicine

## 2017-11-19 NOTE — Progress Notes (Deleted)
Annual Screening Comprehensive Examination   This very nice 37 y.o.female presents for complete physical.  Patient has no major health issues.  Patient reports no complaints at this time.   She is seeing obygn.  She has had normal pap smears.  Periods are normal.  She is doing well with her birth control pill.  No bleeding or spotting in between periods.    She does some walking here and there.  She just got a new exercise machine.    She would like to have a referral to dermatology for a yearly skin survey.  Her mother has a history of melanoma and she would be more comfortable getting a yearly survey.  Finally, patient has history of Vitamin D Deficiency and last vitamin D was  Lab Results  Component Value Date   VD25OH 23 (L) 11/20/2016  .  Currently on supplementation     Current Outpatient Medications on File Prior to Visit  Medication Sig Dispense Refill  . Norethin-Eth Estrad-Fe Biphas (LO LOESTRIN FE PO) Take by mouth daily.     No current facility-administered medications on file prior to visit.     No Known Allergies  Past Medical History:  Diagnosis Date  . Anemia   . Chronic kidney disease    1 functioning kidney, hx stones  . Heart murmur   . Kidney stones     There is no immunization history for the selected administration types on file for this patient.  Past Surgical History:  Procedure Laterality Date  . LITHOTRIPSY    . surgical removal of kidney stones    . WISDOM TOOTH EXTRACTION      Family History  Problem Relation Age of Onset  . Heart disease Father   . Hypertension Maternal Grandmother   . Heart disease Maternal Grandmother     Social History   Socioeconomic History  . Marital status: Married    Spouse name: Not on file  . Number of children: Not on file  . Years of education: Not on file  . Highest education level: Not on file  Social Needs  . Financial resource strain: Not on file  . Food insecurity - worry: Not on file  .  Food insecurity - inability: Not on file  . Transportation needs - medical: Not on file  . Transportation needs - non-medical: Not on file  Occupational History  . Not on file  Tobacco Use  . Smoking status: Never Smoker  . Smokeless tobacco: Never Used  Substance and Sexual Activity  . Alcohol use: Yes    Alcohol/week: 0.6 oz    Types: 1 Glasses of wine per week  . Drug use: No  . Sexual activity: Yes    Birth control/protection: Pill  Other Topics Concern  . Not on file  Social History Narrative  . Not on file   Review of Systems  Constitutional: Negative for chills, fever and malaise/fatigue.  HENT: Negative for congestion, ear pain and sore throat.   Eyes: Negative.   Respiratory: Negative for cough, shortness of breath and wheezing.   Cardiovascular: Negative for chest pain, palpitations and leg swelling.  Gastrointestinal: Negative for abdominal pain, blood in stool, constipation, diarrhea, heartburn and melena.  Genitourinary: Negative.   Skin: Negative.   Neurological: Negative for dizziness, sensory change, loss of consciousness and headaches.  Psychiatric/Behavioral: Negative for depression. The patient is not nervous/anxious and does not have insomnia.       Physical Exam  There were no  vitals taken for this visit.  General Appearance: Well nourished and in no apparent distress. Eyes: PERRLA, EOMs, conjunctiva no swelling or erythema, normal fundi and vessels. Sinuses: No frontal/maxillary tenderness ENT/Mouth: EACs patent / TMs  nl. Nares clear without erythema, swelling, mucoid exudates. Oral hygiene is good. No erythema, swelling, or exudate. Tongue normal, non-obstructing. Tonsils not swollen or erythematous. Hearing normal.  Neck: Supple, thyroid normal. No bruits, nodes or JVD. Respiratory: Respiratory effort normal.  BS equal and clear bilateral without rales, rhonci, wheezing or stridor. Cardio: Heart sounds are normal with regular rate and rhythm and  no murmurs, rubs or gallops. Peripheral pulses are normal and equal bilaterally without edema. No aortic or femoral bruits. Chest: symmetric with normal excursions and percussion.  Abdomen: Flat, soft, with bowl sounds. Nontender, no guarding, rebound, hernias, masses, or organomegaly.  Lymphatics: Non tender without lymphadenopathy.  Musculoskeletal: Full ROM all peripheral extremities, joint stability, 5/5 strength, and normal gait. Skin: Warm and dry without rashes, lesions, cyanosis, clubbing or  ecchymosis.  Neuro: Cranial nerves intact, reflexes equal bilaterally. Normal muscle tone, no cerebellar symptoms. Sensation intact.  Pysch: Awake and oriented X 3, normal affect, Insight and Judgment appropriate.   Assessment and Plan   1. Routine general medical examination at a health care facility   2. Screening for hyperlipidemia  - Lipid panel  3. Screening for diabetes mellitus  - Hemoglobin A1c - Insulin, random  4. Screening for deficiency anemia  - Iron and TIBC - Vitamin B12  5. Screening for hematuria or proteinuria  - Urinalysis, Routine w reflex microscopic - Microalbumin / creatinine urine ratio  6. Vitamin D deficiency -cont Vit D supplement - VITAMIN D 25 Hydroxy (Vit-D Deficiency, Fractures)  7. Screening for thyroid disorder - TSH  8. Medication management  - CBC with Differential/Platelet - BASIC METABOLIC PANEL WITH GFR - Hepatic function panel - Magnesium      Continue prudent diet as discussed, weight control, regular exercise, and medications. Routine screening labs and tests as requested with regular follow-up as recommended.  Over 40 minutes of exam, counseling, chart review and critical decision making was performed

## 2017-11-20 ENCOUNTER — Encounter: Payer: Self-pay | Admitting: Physician Assistant

## 2017-11-20 ENCOUNTER — Encounter: Payer: Self-pay | Admitting: Internal Medicine

## 2017-12-09 DIAGNOSIS — E559 Vitamin D deficiency, unspecified: Secondary | ICD-10-CM | POA: Insufficient documentation

## 2017-12-09 NOTE — Progress Notes (Signed)
Complete Physical  Assessment and Plan: Health Maintenance- Discussed STD testing, safe sex, alcohol and drug awareness, drinking and driving dangers, wearing a seat belt and general safety measures for young adult.  Routine general medical examination at a health care facility  Screening for deficiency anemia -     Vitamin B12  Screening for thyroid disorder -     TSH  Screening for hyperlipidemia -     Lipid panel  Screening for hematuria or proteinuria -     Urinalysis w microscopic + reflex cultur -     Microalbumin / creatinine urine ratio  Screening for diabetes mellitus -     Hemoglobin A1c  Vitamin D deficiency -     VITAMIN D 25 Hydroxy (Vit-D Deficiency, Fractures)  Medication management -     CBC with Differential/Platelet -     BASIC METABOLIC PANEL WITH GFR -     Hepatic function panel  Nonfunctioning kidney Right; 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   Discussed med's effects and SE's. Screening labs and tests as requested with regular follow-up as recommended. Over 40 minutes of exam, counseling, chart review and critical decision making was performed  No future appointments.   HPI  BP 120/80   Pulse 63   Temp 98.1 F (36.7 C)   Ht 5\' 3"  (1.6 m)   Wt 149 lb (67.6 kg)   SpO2 97%   BMI 26.39 kg/m   This very nice 37 y.o.female presents for complete physical.  Patient has no major health issues.  Patient reports no complaints at this time.   She is seeing obygn.  She has had normal pap smears.  Periods are normal.  She is doing well with her nuvaring.  No bleeding or spotting in between periods.    She has a family hx of melanoma and has been referred to dermatology (Dr. Sharyn LullHaverstock) for annual screening; she continues to follow up annually as recommended.   BMI is Body mass index is 26.39 kg/m., she has been working on diet and exercise. Wt Readings from Last 3 Encounters:  12/10/17 149  lb (67.6 kg)  11/20/16 149 lb (67.6 kg)  11/15/15 161 lb (73 kg)   Finally, patient has history of Vitamin D Deficiency and last vitamin D was  Lab Results  Component Value Date   VD25OH 23 (L) 11/20/2016  Currently on supplementation   Current Medications:  Current Outpatient Medications on File Prior to Visit  Medication Sig Dispense Refill  . Cholecalciferol (VITAMIN D3) 5000 units CAPS Take by mouth.    . etonogestrel-ethinyl estradiol (NUVARING) 0.12-0.015 MG/24HR vaginal ring Place 1 each vaginally every 28 (twenty-eight) days. Insert vaginally and leave in place for 3 consecutive weeks, then remove for 1 week.    Kathrynn Running. Norethin-Eth Estrad-Fe Biphas (LO LOESTRIN FE PO) Take by mouth daily.     No current facility-administered medications on file prior to visit.    Health Maintenance:   There is no immunization history for the selected administration types on file for this patient.  TD/TDAP: 2010 Influenza: Declines Pneumovax: n/a Prevnar 13: n/a HPV vaccines: 0/3  LMP: No LMP recorded. Patient is not currently having periods (Reason: Oral contraceptives). Sexually Active: yes STD testing offered Pap: 2018 - OBGYN Dr. Vickey SagesAtkins MGM:  -   Eye exam: Wears glasses for distance, last exam 8 years ago, will schedule to follow up Dentist: Dr. Lequita AsalBen Turner, q6 months  Allergies: No Known Allergies Medical History:  has Cardiac murmur; Vitamin D deficiency; and Nonfunctioning kidney on their problem list. Surgical History:  She  has a past surgical history that includes Lithotripsy; surgical removal of kidney stones (Right); and Wisdom tooth extraction. Family History:  Her family history includes Heart disease in her father and maternal grandmother; Hypertension in her maternal grandmother. Social History:   reports that  has never smoked. she has never used smokeless tobacco. She reports that she drinks about 0.6 oz of alcohol per week. She reports that she does not use  drugs.  Review of Systems: Review of Systems  Constitutional: Negative for malaise/fatigue and weight loss.  HENT: Negative for hearing loss and tinnitus.   Eyes: Negative for blurred vision and double vision.  Respiratory: Negative for cough, sputum production, shortness of breath and wheezing.   Cardiovascular: Negative for chest pain, palpitations, orthopnea, claudication, leg swelling and PND.  Gastrointestinal: Negative for abdominal pain, blood in stool, constipation, diarrhea, heartburn, melena, nausea and vomiting.  Genitourinary: Negative.   Musculoskeletal: Negative for falls, joint pain and myalgias.  Skin: Negative for rash.  Neurological: Negative for dizziness, tingling, sensory change, weakness and headaches.  Endo/Heme/Allergies: Negative for polydipsia.  Psychiatric/Behavioral: Negative.  Negative for depression, memory loss, substance abuse and suicidal ideas. The patient is not nervous/anxious and does not have insomnia.   All other systems reviewed and are negative.   Physical Exam: Estimated body mass index is 26.39 kg/m as calculated from the following:   Height as of this encounter: 5\' 3"  (1.6 m).   Weight as of this encounter: 149 lb (67.6 kg). BP 120/80   Pulse 63   Temp 98.1 F (36.7 C)   Ht 5\' 3"  (1.6 m)   Wt 149 lb (67.6 kg)   SpO2 97%   BMI 26.39 kg/m  General Appearance: Well nourished, in no apparent distress.  Eyes: PERRLA, EOMs, conjunctiva no swelling or erythema, normal fundi and vessels.  Sinuses: No Frontal/maxillary tenderness  ENT/Mouth: Ext aud canals clear, normal light reflex with TMs without erythema, bulging. Good dentition. No erythema, swelling, or exudate on post pharynx. Tonsils not swollen or erythematous. Hearing normal.  Neck: Supple, thyroid normal. No bruits  Respiratory: Respiratory effort normal, BS equal bilaterally without rales, rhonchi, wheezing or stridor.  Cardio: RRR without murmurs, rubs or gallops. Brisk peripheral  pulses without edema.  Chest: symmetric, with normal excursions and percussion.  Breasts: Defer to GYN Abdomen: Soft, nontender, no guarding, rebound, hernias, masses, or organomegaly.  Lymphatics: Non tender without lymphadenopathy.  Genitourinary: Defer to GYN Musculoskeletal: Full ROM all peripheral extremities,5/5 strength, and normal gait.  Skin: Warm, dry without rashes, lesions, ecchymosis. Neuro: Cranial nerves intact, reflexes equal bilaterally. Normal muscle tone, no cerebellar symptoms. Sensation intact.  Psych: Awake and oriented X 3, normal affect, Insight and Judgment appropriate.   EKG: defer, baseline in computer  Carlyon Shadow Camron Essman 9:22 AM [facility]

## 2017-12-10 ENCOUNTER — Ambulatory Visit (INDEPENDENT_AMBULATORY_CARE_PROVIDER_SITE_OTHER): Payer: 59 | Admitting: Adult Health

## 2017-12-10 ENCOUNTER — Encounter: Payer: Self-pay | Admitting: Adult Health

## 2017-12-10 VITALS — BP 120/80 | HR 63 | Temp 98.1°F | Ht 63.0 in | Wt 149.0 lb

## 2017-12-10 DIAGNOSIS — Z0000 Encounter for general adult medical examination without abnormal findings: Secondary | ICD-10-CM | POA: Diagnosis not present

## 2017-12-10 DIAGNOSIS — E559 Vitamin D deficiency, unspecified: Secondary | ICD-10-CM

## 2017-12-10 DIAGNOSIS — Z1389 Encounter for screening for other disorder: Secondary | ICD-10-CM

## 2017-12-10 DIAGNOSIS — Z13220 Encounter for screening for lipoid disorders: Secondary | ICD-10-CM

## 2017-12-10 DIAGNOSIS — Z131 Encounter for screening for diabetes mellitus: Secondary | ICD-10-CM

## 2017-12-10 DIAGNOSIS — Z130 Encounter for screening for diseases of the blood and blood-forming organs and certain disorders involving the immune mechanism: Secondary | ICD-10-CM

## 2017-12-10 DIAGNOSIS — N289 Disorder of kidney and ureter, unspecified: Secondary | ICD-10-CM

## 2017-12-10 DIAGNOSIS — Z1329 Encounter for screening for other suspected endocrine disorder: Secondary | ICD-10-CM

## 2017-12-10 DIAGNOSIS — Z79899 Other long term (current) drug therapy: Secondary | ICD-10-CM

## 2017-12-10 NOTE — Patient Instructions (Signed)
Aim for 7+ servings of fruits and vegetables daily  80+ fluid ounces of water or unsweet tea for healthy kidneys  Limit alcohol to make 1 drink per day  Limit animal fats in diet for cholesterol and heart health - choose grass fed whenever available  Aim for low stress - take time to unwind and care for your mental health  Aim for 150 min of moderate intensity exercise weekly for heart health, and weights twice weekly for bone health  Aim for 7-9 hours of sleep daily      When it comes to diets, agreement about the perfect plan isn't easy to find, even among the experts. Experts at the [employer] developed an idea known as the Healthy Eating Plate. Just imagine a plate divided into logical, healthy portions.  The emphasis is on diet quality:  Load up on vegetables and fruits - one-half of your plate: Aim for color and variety, and remember that potatoes don't count.  Go for whole grains - one-quarter of your plate: Whole wheat, barley, wheat berries, quinoa, oats, brown rice, and foods made with them. If you want pasta, go with whole wheat pasta.  Protein power - one-quarter of your plate: Fish, chicken, beans, and nuts are all healthy, versatile protein sources. Limit red meat.  The diet, however, does go beyond the plate, offering a few other suggestions.  Use healthy plant oils, such as olive, canola, soy, corn, sunflower and peanut. Check the labels, and avoid partially hydrogenated oil, which have unhealthy trans fats.  If you're thirsty, drink water. Coffee and tea are good in moderation, but skip sugary drinks and limit milk and dairy products to one or two daily servings.  The type of carbohydrate in the diet is more important than the amount. Some sources of carbohydrates, such as vegetables, fruits, whole grains, and beans-are healthier than others.  Finally, stay active.

## 2017-12-13 ENCOUNTER — Other Ambulatory Visit: Payer: Self-pay | Admitting: Adult Health

## 2017-12-13 DIAGNOSIS — N3000 Acute cystitis without hematuria: Secondary | ICD-10-CM

## 2017-12-13 LAB — BASIC METABOLIC PANEL WITH GFR
BUN: 11 mg/dL (ref 7–25)
CHLORIDE: 102 mmol/L (ref 98–110)
CO2: 29 mmol/L (ref 20–32)
Calcium: 9.7 mg/dL (ref 8.6–10.2)
Creat: 0.92 mg/dL (ref 0.50–1.10)
GFR, EST AFRICAN AMERICAN: 93 mL/min/{1.73_m2} (ref >=60)
GFR, Est Non African American: 80 mL/min/{1.73_m2} (ref >=60)
Glucose, Bld: 85 mg/dL (ref 65–99)
POTASSIUM: 4.1 mmol/L (ref 3.5–5.3)
Sodium: 138 mmol/L (ref 135–146)

## 2017-12-13 LAB — HEPATIC FUNCTION PANEL
AG Ratio: 1.5 (calc) (ref 1.0–2.5)
ALT: 10 U/L (ref 6–29)
AST: 13 U/L (ref 10–30)
Albumin: 4.3 g/dL (ref 3.6–5.1)
Alkaline phosphatase (APISO): 47 U/L (ref 33–115)
BILIRUBIN DIRECT: 0.1 mg/dL (ref 0.0–0.2)
BILIRUBIN TOTAL: 0.6 mg/dL (ref 0.2–1.2)
Globulin: 2.9 g/dL (calc) (ref 1.9–3.7)
Indirect Bilirubin: 0.5 mg/dL (calc) (ref 0.2–1.2)
Total Protein: 7.2 g/dL (ref 6.1–8.1)

## 2017-12-13 LAB — URINALYSIS W MICROSCOPIC + REFLEX CULTURE
Bacteria, UA: NONE SEEN /HPF
Bilirubin Urine: NEGATIVE
Glucose, UA: NEGATIVE
HYALINE CAST: NONE SEEN /LPF
Hgb urine dipstick: NEGATIVE
Ketones, ur: NEGATIVE
Nitrites, Initial: NEGATIVE
PROTEIN: NEGATIVE
RBC / HPF: NONE SEEN /HPF (ref 0–2)
Specific Gravity, Urine: 1.011 (ref 1.001–1.03)
WBC, UA: NONE SEEN /HPF (ref 0–5)
pH: 7.5 (ref 5.0–8.0)

## 2017-12-13 LAB — CBC WITH DIFFERENTIAL/PLATELET
Basophils Absolute: 41 cells/uL (ref 0–200)
Basophils Relative: 1 %
EOS ABS: 172 {cells}/uL (ref 15–500)
Eosinophils Relative: 4.2 %
HCT: 39 % (ref 35.0–45.0)
Hemoglobin: 13.3 g/dL (ref 11.7–15.5)
Lymphs Abs: 1365 cells/uL (ref 850–3900)
MCH: 30.1 pg (ref 27.0–33.0)
MCHC: 34.1 g/dL (ref 32.0–36.0)
MCV: 88.2 fL (ref 80.0–100.0)
MPV: 9.4 fL (ref 7.5–12.5)
Monocytes Relative: 8.8 %
Neutro Abs: 2161 cells/uL (ref 1500–7800)
Neutrophils Relative %: 52.7 %
PLATELETS: 341 10*3/uL (ref 140–400)
RBC: 4.42 10*6/uL (ref 3.80–5.10)
RDW: 11.7 % (ref 11.0–15.0)
TOTAL LYMPHOCYTE: 33.3 %
WBC mixed population: 361 cells/uL (ref 200–950)
WBC: 4.1 10*3/uL (ref 3.8–10.8)

## 2017-12-13 LAB — TSH: TSH: 2.25 m[IU]/L

## 2017-12-13 LAB — VITAMIN B12: VITAMIN B 12: 443 pg/mL (ref 200–1100)

## 2017-12-13 LAB — LIPID PANEL
Cholesterol: 177 mg/dL (ref <200)
HDL: 86 mg/dL (ref >50)
LDL Cholesterol (Calc): 77 mg/dL (calc)
Non-HDL Cholesterol (Calc): 91 mg/dL (calc) (ref <130)
Total CHOL/HDL Ratio: 2.1 (calc) (ref <5.0)
Triglycerides: 63 mg/dL (ref <150)

## 2017-12-13 LAB — URINE CULTURE
MICRO NUMBER:: 90344421
SPECIMEN QUALITY:: ADEQUATE

## 2017-12-13 LAB — MICROALBUMIN / CREATININE URINE RATIO
Creatinine, Urine: 47 mg/dL (ref 20–275)
MICROALB UR: 0.2 mg/dL
MICROALB/CREAT RATIO: 4 ug/mg{creat} (ref <30)

## 2017-12-13 LAB — CULTURE INDICATED

## 2017-12-13 LAB — HEMOGLOBIN A1C
Hgb A1c MFr Bld: 5.1 % of total Hgb (ref <5.7)
MEAN PLASMA GLUCOSE: 100 (calc)
eAG (mmol/L): 5.5 (calc)

## 2017-12-13 LAB — VITAMIN D 25 HYDROXY (VIT D DEFICIENCY, FRACTURES): Vit D, 25-Hydroxy: 58 ng/mL (ref 30–100)

## 2017-12-13 MED ORDER — CEPHALEXIN 250 MG PO CAPS: ORAL_CAPSULE | ORAL | 0 refills | Status: DC

## 2018-11-20 ENCOUNTER — Encounter: Payer: Self-pay | Admitting: Physician Assistant

## 2018-12-11 NOTE — Progress Notes (Signed)
Complete Physical  Assessment and Plan:   Routine general medical examination at a health care facility Health Maintenance- Discussed STD testing, safe sex, alcohol and drug awareness, drinking and driving dangers, wearing a seat belt and general safety measures for young adult. 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   Need for tetanus vaccine She likely had when she was pregnant; she will verify with OBGYN at upcoming appointment  Sensation of skipped heart beats Improved with caffeine reduction; discussed stress management EKG normal, hx of normal Holter Continue lifestyle modification; follow up if persisting, progressive or with any new accompaniments  Discussed med's effects and SE's. Screening labs and tests as requested with regular follow-up as recommended. Over 40 minutes of exam, counseling, chart review and critical decision making was performed  Future Appointments  Date Time Provider Department Center  12/17/2019  9:00 AM Judd Gaudier, NP [facility] None     HPI  BP 118/82   Pulse 76   Temp 98.1 F (36.7 C)   Ht 5\' 3"  (1.6 m)   Wt 152 lb 3.2 oz (69 kg)   SpO2 98%   BMI 26.96 kg/m   This very nice 38 y.o.female presents for complete physical.  She has Cardiac murmur; Vitamin D deficiency; and Nonfunctioning kidney on their problem list.  Patient reports no complaints at this time.   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   She is seeing obygn.  She has had normal pap smears.  Periods are normal.  She is doing well with her nuvaring.  No bleeding or spotting in between periods.    She has a family hx of melanoma and has been referred to dermatology (Dr. Sharyn Lull) for annual screening; she continues to follow up annually as recommended.   BMI is Body mass index is 26.96 kg/m., she has been working on diet and exercise. She walks for 1 hour 3 days a week with kids. She drinks 1 cup of coffee daily. Doesn't drink sweet tea or soda. Estimates 90 fluid ounces of water daily. She does estimate 5 servings of fruits/vegetables daily, 2 servings of red meat daily. Sleeps well.  Wt Readings from Last 3 Encounters:  12/16/18 152 lb 3.2 oz (69 kg)  12/10/17 149 lb (67.6 kg)  11/20/16 149 lb (67.6 kg)   Today their BP is BP: 118/82  She does workout. She denies chest pain, shortness of breath, dizziness. She does report some sensation of skipped beats this past year; she has cut down on caffeine and reports this has reduced frequency by about 1/2. She denies any accompaniments with this, not associated with exertion. She has had skipped beats in past, has reportedly had holter monitor 10+ years ago which was normal.    She is not on cholesterol medication.The cholesterol last visit was:  Lab Results  Component Value Date   CHOL 177 12/10/2017   HDL 86 12/10/2017   LDLCALC 77 12/10/2017   TRIG 63 12/10/2017   CHOLHDL 2.1 12/10/2017   Last Z6X in the office was:  Lab Results  Component Value Date   HGBA1C 5.1 12/10/2017    She has hx of non-functioning kidney as child, established with [facility] as needed, recently with stable renal function:  Lab Results  Component Value Date   GFRNONAA 80 12/10/2017   Lab Results  Component Value Date   VITAMINB12 443 12/10/2017   Finally, patient has history of Vitamin D Deficiency and last vitamin D was  Lab Results  Component Value Date    VD25OH 58 12/10/2017  Currently on supplementation taking 5000 IU daily.    Current Medications:  Current Outpatient Medications on File Prior to Visit  Medication Sig Dispense Refill  . Cholecalciferol (VITAMIN D3) 5000 units CAPS Take by mouth.     No current facility-administered medications on file prior to visit.    Health Maintenance:   There is no immunization history for the selected administration types on file for this patient.   TD/TDAP: 2010, ? Had when pregnant, will check with GYN  Influenza: Declines Pneumovax: n/a Prevnar 13: n/a HPV vaccines: 0/3  LMP: No LMP recorded. Sexually Active: yes STD testing offered, declined Pap: 12/2017 - OBGYN Dr. Vickey Sages MGM:  -   Eye exam: Wears glasses for distance, last exam 8 years ago, will schedule to follow up Dentist: Dr. Lequita Asal, q6 months, last visit 2019 Dermatology: Dr. Sharyn Lull, goes annually, last visit 2019   Allergies: No Known Allergies Medical History:  has Cardiac murmur; Vitamin D deficiency; and Nonfunctioning kidney on their problem list. Surgical History:  She  has a past surgical history that includes Lithotripsy (Right, 1987); surgical removal of kidney stones (Right); and Wisdom tooth extraction. Family History:  Her family history includes Diabetes type II in her paternal grandfather; Heart attack (age of onset: 70) in her father; Heart disease in her father; Hyperlipidemia in her maternal grandmother; Hypertension in her maternal grandmother; Kidney Stones in her father; Melanoma in her mother. Social History:   reports that she has never smoked. She has never used smokeless tobacco. She reports current alcohol use of about 1.0 standard drinks of alcohol per week. She reports that she does not use drugs.  Review of Systems: Review of Systems  Constitutional: Negative for malaise/fatigue and weight loss.  HENT: Negative for hearing loss and tinnitus.   Eyes: Negative for blurred vision and double  vision.  Respiratory: Negative for cough, sputum production, shortness of breath and wheezing.   Cardiovascular: Negative for chest pain, palpitations, orthopnea, claudication, leg swelling and PND.       Sensation of skipped heart beats  Gastrointestinal: Negative for abdominal pain, blood in stool, constipation, diarrhea, heartburn, melena, nausea and vomiting.  Genitourinary: Negative.   Musculoskeletal: Negative for falls, joint pain and myalgias.  Skin: Negative for rash.  Neurological: Negative for dizziness, tingling, sensory change, weakness and headaches.  Endo/Heme/Allergies: Negative for polydipsia.  Psychiatric/Behavioral: Negative.  Negative for depression, memory loss, substance abuse and suicidal ideas. The patient is not nervous/anxious and does not have insomnia.   All other systems reviewed and are negative.   Physical Exam: Estimated body mass index is 26.96 kg/m as calculated from the following:   Height as of this encounter:  (1.6 m).   Weight as of this encounter: 152 lb  3.2 oz (69 kg). BP 118/82   Pulse 76   Temp 98.1 F (36.7 C)   Ht 5\' 3"  (1.6 m)   Wt 152 lb 3.2 oz (69 kg)   SpO2 98%   BMI 26.96 kg/m  General Appearance: Well nourished, in no apparent distress.  Eyes: PERRLA, EOMs, conjunctiva no swelling or erythema, normal fundi and vessels.  Sinuses: No Frontal/maxillary tenderness  ENT/Mouth: Ext aud canals clear, normal light reflex with TMs without erythema, bulging. Good dentition. No erythema, swelling, or exudate on post pharynx. Tonsils not swollen or erythematous. Hearing normal.  Neck: Supple, thyroid normal. No bruits  Respiratory: Respiratory effort normal, BS equal bilaterally without rales, rhonchi, wheezing or stridor.  Cardio: RRR without murmurs, rubs or gallops. Brisk peripheral pulses without edema.  Chest: symmetric, with normal excursions and percussion.  Breasts: Defer to GYN Abdomen: Soft, nontender, no guarding, rebound,  hernias, masses, or organomegaly.  Lymphatics: Non tender without lymphadenopathy.  Genitourinary: Defer to GYN Musculoskeletal: Full ROM all peripheral extremities,5/5 strength, and normal gait.  Skin: Warm, dry without rashes, lesions, ecchymosis. Neuro: Cranial nerves intact, reflexes equal bilaterally. Normal muscle tone, no cerebellar symptoms. Sensation intact.  Psych: Awake and oriented X 3, normal affect, Insight and Judgment appropriate.   EKG: NSR, NSCPT  Carlyon Shadow Albie Arizpe 9:49 AM [facility]

## 2018-12-16 ENCOUNTER — Other Ambulatory Visit: Payer: Self-pay

## 2018-12-16 ENCOUNTER — Encounter: Payer: Self-pay | Admitting: Adult Health

## 2018-12-16 ENCOUNTER — Ambulatory Visit (INDEPENDENT_AMBULATORY_CARE_PROVIDER_SITE_OTHER): Payer: No Typology Code available for payment source | Admitting: Adult Health

## 2018-12-16 VITALS — BP 118/82 | HR 76 | Temp 98.1°F | Ht 63.0 in | Wt 152.2 lb

## 2018-12-16 DIAGNOSIS — Z136 Encounter for screening for cardiovascular disorders: Secondary | ICD-10-CM | POA: Diagnosis not present

## 2018-12-16 DIAGNOSIS — I459 Conduction disorder, unspecified: Secondary | ICD-10-CM

## 2018-12-16 DIAGNOSIS — N289 Disorder of kidney and ureter, unspecified: Secondary | ICD-10-CM

## 2018-12-16 DIAGNOSIS — Z79899 Other long term (current) drug therapy: Secondary | ICD-10-CM

## 2018-12-16 DIAGNOSIS — Z0000 Encounter for general adult medical examination without abnormal findings: Secondary | ICD-10-CM

## 2018-12-16 DIAGNOSIS — E559 Vitamin D deficiency, unspecified: Secondary | ICD-10-CM

## 2018-12-16 DIAGNOSIS — R011 Cardiac murmur, unspecified: Secondary | ICD-10-CM

## 2018-12-16 NOTE — Patient Instructions (Addendum)
  Ms. Christina Montes , Thank you for taking time to come for your Annual Wellness Visit. I appreciate your ongoing commitment to your health goals. Please review the following plan we discussed and let me know if I can assist you in the future.   These are the goals we discussed: Goals    . DIET - EAT MORE FRUITS AND VEGETABLES     7+ servings (1/2 cup each) daily        This is a list of the screening recommended for you and due dates:  Health Maintenance  Topic Date Due  . Flu Shot  04/25/2018  . Pap Smear  11/24/2019  . Tetanus Vaccine  09/26/2023  . HIV Screening  Completed    Please verify with OBGYN when your last tetanus was - anticipate got this with last pregnancy   Know what a healthy weight is for you (roughly BMI <25) and aim to maintain this  Aim for 7+ servings of fruits and vegetables daily  65-80+ fluid ounces of water or unsweet tea for healthy kidneys  Limit to max 1 drink of alcohol per day; avoid smoking/tobacco  Limit animal fats in diet for cholesterol and heart health - choose grass fed whenever available  Limit red meat intake to <6 oz per week  Avoid highly processed foods, and foods high in saturated/trans fats  Aim for low stress - take time to unwind and care for your mental health  Aim for 150 min of moderate intensity exercise weekly for heart health, and weights twice weekly for bone health  Aim for 7-9 hours of sleep daily       When it comes to diets, agreement about the perfect plan isn't easy to find, even among the experts. Experts at the [facility] [employer] developed an idea known as the Healthy Eating Plate. Just imagine a plate divided into logical, healthy portions.  The emphasis is on diet quality:  Load up on vegetables and fruits - one-half of your plate: Aim for color and variety, and remember that potatoes don't count.  Go for whole grains - one-quarter of your plate: Whole wheat, barley, wheat berries, quinoa,  oats, brown rice, and foods made with them. If you want pasta, go with whole wheat pasta.  Protein power - one-quarter of your plate: Fish, chicken, beans, and nuts are all healthy, versatile protein sources. Limit red meat.  The diet, however, does go beyond the plate, offering a few other suggestions.  Use healthy plant oils, such as olive, canola, soy, corn, sunflower and peanut. Check the labels, and avoid partially hydrogenated oil, which have unhealthy trans fats.  If you're thirsty, drink water. Coffee and tea are good in moderation, but skip sugary drinks and limit milk and dairy products to one or two daily servings.  The type of carbohydrate in the diet is more important than the amount. Some sources of carbohydrates, such as vegetables, fruits, whole grains, and beans-are healthier than others.  Finally, stay active.

## 2018-12-17 ENCOUNTER — Other Ambulatory Visit: Payer: Self-pay | Admitting: Adult Health

## 2018-12-17 LAB — URINALYSIS, ROUTINE W REFLEX MICROSCOPIC
Bilirubin Urine: NEGATIVE
Glucose, UA: NEGATIVE
HGB URINE DIPSTICK: NEGATIVE
Ketones, ur: NEGATIVE
Leukocytes,Ua: NEGATIVE
Nitrite: NEGATIVE
Protein, ur: NEGATIVE
Specific Gravity, Urine: 1.019 (ref 1.001–1.03)
pH: 7 (ref 5.0–8.0)

## 2018-12-17 LAB — COMPLETE METABOLIC PANEL WITH GFR
AG Ratio: 1.6 (calc) (ref 1.0–2.5)
ALBUMIN MSPROF: 4.1 g/dL (ref 3.6–5.1)
ALT: 8 U/L (ref 6–29)
AST: 14 U/L (ref 10–30)
Alkaline phosphatase (APISO): 49 U/L (ref 31–125)
BUN: 12 mg/dL (ref 7–25)
CO2: 28 mmol/L (ref 20–32)
CREATININE: 0.76 mg/dL (ref 0.50–1.10)
Calcium: 9.3 mg/dL (ref 8.6–10.2)
Chloride: 104 mmol/L (ref 98–110)
GFR, Est African American: 116 mL/min/{1.73_m2} (ref >=60)
GFR, Est Non African American: 100 mL/min/{1.73_m2} (ref >=60)
GLOBULIN: 2.6 g/dL (ref 1.9–3.7)
Glucose, Bld: 85 mg/dL (ref 65–99)
Potassium: 4 mmol/L (ref 3.5–5.3)
SODIUM: 138 mmol/L (ref 135–146)
Total Bilirubin: 0.6 mg/dL (ref 0.2–1.2)
Total Protein: 6.7 g/dL (ref 6.1–8.1)

## 2018-12-17 LAB — MICROALBUMIN / CREATININE URINE RATIO
Creatinine, Urine: 115 mg/dL (ref 20–275)
MICROALB/CREAT RATIO: 3 ug/mg{creat} (ref <30)
Microalb, Ur: 0.4 mg/dL

## 2018-12-17 LAB — CBC WITH DIFFERENTIAL/PLATELET
Absolute Monocytes: 432 cells/uL (ref 200–950)
BASOS ABS: 31 {cells}/uL (ref 0–200)
Basophils Relative: 0.6 %
EOS ABS: 203 {cells}/uL (ref 15–500)
Eosinophils Relative: 3.9 %
HCT: 38.9 % (ref 35.0–45.0)
HEMOGLOBIN: 13.2 g/dL (ref 11.7–15.5)
Lymphs Abs: 1076 cells/uL (ref 850–3900)
MCH: 30.3 pg (ref 27.0–33.0)
MCHC: 33.9 g/dL (ref 32.0–36.0)
MCV: 89.4 fL (ref 80.0–100.0)
MONOS PCT: 8.3 %
MPV: 9.7 fL (ref 7.5–12.5)
Neutro Abs: 3458 cells/uL (ref 1500–7800)
Neutrophils Relative %: 66.5 %
Platelets: 303 10*3/uL (ref 140–400)
RBC: 4.35 10*6/uL (ref 3.80–5.10)
RDW: 12.3 % (ref 11.0–15.0)
TOTAL LYMPHOCYTE: 20.7 %
WBC: 5.2 10*3/uL (ref 3.8–10.8)

## 2018-12-17 LAB — MAGNESIUM: Magnesium: 1.9 mg/dL (ref 1.5–2.5)

## 2018-12-17 LAB — VITAMIN D 25 HYDROXY (VIT D DEFICIENCY, FRACTURES): Vit D, 25-Hydroxy: 48 ng/mL (ref 30–100)

## 2018-12-17 LAB — TSH: TSH: 2.42 mIU/L

## 2019-10-21 ENCOUNTER — Other Ambulatory Visit: Payer: Managed Care, Other (non HMO)

## 2019-12-16 DIAGNOSIS — E663 Overweight: Secondary | ICD-10-CM | POA: Insufficient documentation

## 2019-12-16 NOTE — Progress Notes (Signed)
Complete Physical  Assessment and Plan:   Routine general medical examination at a health care facility Health Maintenance- Discussed STD testing, safe sex, alcohol and drug awareness, drinking and driving dangers, wearing a seat belt and general safety measures for young adult. 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   Sensation of skipped heart beats Improved with caffeine reduction; discussed stress management Had recent normal EKG, hx of normal Holter Continue lifestyle modification; follow up if persisting, progressive or with any new accompaniments  Discussed med's effects and SE's. Screening labs and tests as requested with regular follow-up as recommended. Over 40 minutes of exam, counseling, chart review and critical decision making was performed  Future Appointments  Date Time Provider [place]  12/17/2020  9:30 AM Liane Comber, NP [facility] None     HPI  BP 128/80   Pulse 74   Temp 97.7 F (36.5 C)   Ht 5' 2.5" (1.588 m)   Wt 152 lb (68.9 kg)   SpO2 98%   BMI 27.36 kg/m   This very nice 39 y.o.female presents for complete physical.  She has Cardiac murmur; Vitamin D deficiency; Nonfunctioning kidney; and Overweight (BMI 25.0-29.9) on their problem list.   Patient reports no complaints at this time.   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   She is [facility]. She has had normal pap smears.  Periods are normal.  Husband had a vasectomy.   She has a family hx of melanoma and has been referred to dermatology (Dr. Renda Rolls) for annual screening; she continues to follow up annually as recommended.   BMI is Body mass index is 27.36 kg/m., she has been working on diet and exercise. She walks for 1 hour 3 days a week with kids. She drinks 1 cup of coffee daily. Doesn't drink sweet tea or soda. Estimates 90 fluid ounces of water daily. She does estimate 5-7 servings of fruits/vegetables daily, 2 servings of red meat weekly. Sleeps well.  Wt Readings from Last 3 Encounters:  12/17/19 152 lb (68.9 kg)  12/16/18 152 lb 3.2 oz (69 kg)  12/10/17 149 lb (67.6 kg)   Today their BP is BP: 128/80  She does workout. She denies chest pain, shortness of breath, dizziness. She reported sensation of skipped heart beats, no accompaniments, cut down on caffeine and much improved, minimal recently. Had normal EKG and labs last year; She has had skipped beats in past, has reportedly had holter monitor several years ago which was normal.    She is not on cholesterol medication.The cholesterol last visit was:   Lab Results  Component Value Date   CHOL 177 12/10/2017   HDL 86 12/10/2017   LDLCALC 77 12/10/2017   TRIG 63 12/10/2017  CHOLHDL 2.1 12/10/2017   Last W2H in the office was:  Lab Results  Component Value Date   HGBA1C 5.1 12/10/2017    She has hx of non-functioning kidney as child, established with [facility] as needed, recently with stable renal function:  Lab Results  Component Value Date   GFRNONAA 100 12/16/2018   Lab Results  Component Value Date   VITAMINB12 443 12/10/2017   Finally, patient has history of Vitamin D Deficiency and last vitamin D was  Lab Results  Component Value Date   VD25OH 48 12/16/2018  Currently on supplementation taking 5000 IU daily.    Current Medications:  Current Outpatient  Medications on File Prior to Visit  Medication Sig Dispense Refill  . Cholecalciferol (VITAMIN D3) 5000 units CAPS Take by mouth.    . Multiple Vitamin (MULTIVITAMIN ADULT PO) Take 1 tablet by mouth daily.     No current facility-administered medications on file prior to visit.   Health Maintenance:   There is no immunization history for the selected administration types on file for this patient.   TD/TDAP: 2015 Influenza: Declines Pneumovax: n/a Prevnar 13: n/a HPV vaccines: 0/3, declines   LMP: No LMP recorded. Sexually Active: yes STD testing offered, declined Pap: 12/2017 - OBGYN Dr. Renaldo Fiddler MGM:  -   Eye exam: My eye MD in [place], Wears glasses for distance, contacts, 08/2019  Dentist: Dr. Lequita Asal, q6 months, last visit 2020 Dermatology: Dr. Sharyn Lull, goes annually, last visit 2020  Allergies: No Known Allergies Medical History:  has Cardiac murmur; Vitamin D deficiency; Nonfunctioning kidney; and Overweight (BMI 25.0-29.9) on their problem list. Surgical History:  She  has a past surgical history that includes Lithotripsy (Right, 1987); surgical removal of kidney stones (Right, 1987); and Wisdom tooth extraction. Family History:  Her family history includes Diabetes type II in her paternal grandfather; Heart attack (age of onset: 55) in her father; Heart disease in her father; Hyperlipidemia in her maternal grandmother and mother; Hypertension in her maternal grandmother and mother; Kidney Stones in her father; Melanoma in her mother. Social History:   reports that she has never smoked. She has never used smokeless tobacco. She reports current alcohol use. She reports that she does not use drugs.  Review of Systems: Review of Systems  Constitutional: Negative for malaise/fatigue and weight loss.  HENT: Negative for hearing loss and tinnitus.   Eyes: Negative for blurred vision and double vision.  Respiratory: Negative for cough, sputum production, shortness of  breath and wheezing.   Cardiovascular: Negative for chest pain, palpitations, orthopnea, claudication, leg swelling and PND.  Gastrointestinal: Negative for abdominal pain, blood in stool, constipation, diarrhea, heartburn, melena, nausea and vomiting.  Genitourinary: Negative.   Musculoskeletal: Negative for falls, joint pain and myalgias.  Skin: Negative for rash.  Neurological: Negative for dizziness, tingling, sensory change, weakness and headaches.  Endo/Heme/Allergies: Negative for polydipsia.  Psychiatric/Behavioral: Negative.  Negative for depression, memory loss, substance abuse and suicidal ideas. The patient is not nervous/anxious and does not have insomnia.   All other systems reviewed and are negative.   Physical Exam: Estimated body mass index is 27.36 kg/m as calculated from the following:   Height as of this encounter: 5' 2.5" (1.588 m).   Weight as of this encounter: 152 lb (68.9 kg). BP 128/80   Pulse 74   Temp 97.7 F (36.5 C)   Ht 5' 2.5" (1.588 m)   Wt 152 lb (68.9 kg)   SpO2 98%   BMI  27.36 kg/m  General Appearance: Well nourished, in no apparent distress.  Eyes: PERRLA, EOMs, conjunctiva no swelling or erythema Sinuses: No Frontal/maxillary tenderness  ENT/Mouth: Ext aud canals clear, normal light reflex with TMs without erythema, bulging. Good dentition. No erythema, swelling, or exudate on post pharynx. Tonsils not swollen or erythematous. Hearing normal.  Neck: Supple, thyroid normal. No bruits  Respiratory: Respiratory effort normal, BS equal bilaterally without rales, rhonchi, wheezing or stridor.  Cardio: RRR without murmurs, rubs or gallops. Brisk peripheral pulses without edema.  Chest: symmetric, with normal excursions and percussion.  Breasts: Defer to GYN Abdomen: Soft, nontender, no guarding, rebound, hernias, masses, or organomegaly.  Lymphatics: Non tender without lymphadenopathy.  Genitourinary: Defer to GYN Musculoskeletal: Full ROM all  peripheral extremities,5/5 strength, and normal gait.  Skin: Warm, dry without rashes, lesions, ecchymosis. Neuro: Cranial nerves intact, reflexes equal bilaterally. Normal muscle tone, no cerebellar symptoms. Sensation intact.  Psych: Awake and oriented X 3, normal affect, Insight and Judgment appropriate.   EKG: NSR, NSCPT in 2020, defer  MOLLY SAVARINO 9:26 AM [facility]

## 2019-12-17 ENCOUNTER — Encounter: Payer: Self-pay | Admitting: Adult Health

## 2019-12-17 ENCOUNTER — Other Ambulatory Visit: Payer: Self-pay

## 2019-12-17 ENCOUNTER — Ambulatory Visit (INDEPENDENT_AMBULATORY_CARE_PROVIDER_SITE_OTHER): Payer: No Typology Code available for payment source | Admitting: Adult Health

## 2019-12-17 VITALS — BP 128/80 | HR 74 | Temp 97.7°F | Ht 62.5 in | Wt 152.0 lb

## 2019-12-17 DIAGNOSIS — E538 Deficiency of other specified B group vitamins: Secondary | ICD-10-CM

## 2019-12-17 DIAGNOSIS — N289 Disorder of kidney and ureter, unspecified: Secondary | ICD-10-CM

## 2019-12-17 DIAGNOSIS — Z1329 Encounter for screening for other suspected endocrine disorder: Secondary | ICD-10-CM

## 2019-12-17 DIAGNOSIS — E663 Overweight: Secondary | ICD-10-CM

## 2019-12-17 DIAGNOSIS — Z0000 Encounter for general adult medical examination without abnormal findings: Secondary | ICD-10-CM

## 2019-12-17 DIAGNOSIS — D649 Anemia, unspecified: Secondary | ICD-10-CM

## 2019-12-17 DIAGNOSIS — Z8349 Family history of other endocrine, nutritional and metabolic diseases: Secondary | ICD-10-CM

## 2019-12-17 DIAGNOSIS — E559 Vitamin D deficiency, unspecified: Secondary | ICD-10-CM

## 2019-12-17 NOTE — Patient Instructions (Addendum)
  Christina Montes , Thank you for taking time to come for your Annual Wellness Visit. I appreciate your ongoing commitment to your health goals. Please review the following plan we discussed and let me know if I can assist you in the future.   These are the goals we discussed: Goals    . DIET - EAT MORE FRUITS AND VEGETABLES     7+ servings (1/2 cup each) daily     . Weight (lb) < 140 lb (63.5 kg)       This is a list of the screening recommended for you and due dates:  Health Maintenance  Topic Date Due  . Pap Smear  12/17/2019*  . Flu Shot  12/24/2019*  . Tetanus Vaccine  09/26/2023  . HIV Screening  Completed  *Topic was postponed. The date shown is not the original due date.      Know what a healthy weight is for you (roughly BMI <25) and aim to maintain this  Aim for 7+ servings of fruits and vegetables daily  65-80+ fluid ounces of water or unsweet tea for healthy kidneys  Limit to max 1 drink of alcohol per day; avoid smoking/tobacco  Limit animal fats in diet for cholesterol and heart health - choose grass fed whenever available  Avoid highly processed foods, and foods high in saturated/trans fats  Aim for low stress - take time to unwind and care for your mental health  Aim for 150 min of moderate intensity exercise weekly for heart health, and weights twice weekly for bone health  Aim for 7-9 hours of sleep daily

## 2019-12-18 LAB — TSH: TSH: 1.95 mIU/L

## 2019-12-18 LAB — URINALYSIS, ROUTINE W REFLEX MICROSCOPIC
Bilirubin Urine: NEGATIVE
Glucose, UA: NEGATIVE
Hgb urine dipstick: NEGATIVE
Ketones, ur: NEGATIVE
Leukocytes,Ua: NEGATIVE
Nitrite: NEGATIVE
Protein, ur: NEGATIVE
Specific Gravity, Urine: 1.026 (ref 1.001–1.03)
pH: 7 (ref 5.0–8.0)

## 2019-12-18 LAB — MAGNESIUM: Magnesium: 2.1 mg/dL (ref 1.5–2.5)

## 2019-12-18 LAB — COMPLETE METABOLIC PANEL WITH GFR
AG Ratio: 1.9 (calc) (ref 1.0–2.5)
ALT: 9 U/L (ref 6–29)
AST: 13 U/L (ref 10–30)
Albumin: 4.5 g/dL (ref 3.6–5.1)
Alkaline phosphatase (APISO): 52 U/L (ref 31–125)
BUN: 13 mg/dL (ref 7–25)
CO2: 25 mmol/L (ref 20–32)
Calcium: 9.5 mg/dL (ref 8.6–10.2)
Chloride: 101 mmol/L (ref 98–110)
Creat: 0.86 mg/dL (ref 0.50–1.10)
GFR, Est African American: 99 mL/min/{1.73_m2} (ref >=60)
GFR, Est Non African American: 86 mL/min/{1.73_m2} (ref >=60)
Globulin: 2.4 g/dL (calc) (ref 1.9–3.7)
Glucose, Bld: 83 mg/dL (ref 65–99)
Potassium: 4.3 mmol/L (ref 3.5–5.3)
Sodium: 136 mmol/L (ref 135–146)
Total Bilirubin: 0.8 mg/dL (ref 0.2–1.2)
Total Protein: 6.9 g/dL (ref 6.1–8.1)

## 2019-12-18 LAB — CBC WITH DIFFERENTIAL/PLATELET
Absolute Monocytes: 699 cells/uL (ref 200–950)
Basophils Absolute: 57 cells/uL (ref 0–200)
Basophils Relative: 0.9 %
Eosinophils Absolute: 391 cells/uL (ref 15–500)
Eosinophils Relative: 6.2 %
HCT: 43.1 % (ref 35.0–45.0)
Hemoglobin: 14.2 g/dL (ref 11.7–15.5)
Lymphs Abs: 1304 cells/uL (ref 850–3900)
MCH: 30.3 pg (ref 27.0–33.0)
MCHC: 32.9 g/dL (ref 32.0–36.0)
MCV: 91.9 fL (ref 80.0–100.0)
MPV: 9.8 fL (ref 7.5–12.5)
Monocytes Relative: 11.1 %
Neutro Abs: 3849 cells/uL (ref 1500–7800)
Neutrophils Relative %: 61.1 %
Platelets: 304 10*3/uL (ref 140–400)
RBC: 4.69 10*6/uL (ref 3.80–5.10)
RDW: 11.9 % (ref 11.0–15.0)
Total Lymphocyte: 20.7 %
WBC: 6.3 10*3/uL (ref 3.8–10.8)

## 2019-12-18 LAB — MICROALBUMIN / CREATININE URINE RATIO
Creatinine, Urine: 117 mg/dL (ref 20–275)
Microalb Creat Ratio: 3 mcg/mg creat (ref <30)
Microalb, Ur: 0.3 mg/dL

## 2019-12-18 LAB — VITAMIN D 25 HYDROXY (VIT D DEFICIENCY, FRACTURES): Vit D, 25-Hydroxy: 54 ng/mL (ref 30–100)

## 2019-12-18 LAB — VITAMIN B12: Vitamin B-12: 384 pg/mL (ref 200–1100)

## 2020-02-11 ENCOUNTER — Other Ambulatory Visit: Payer: Self-pay | Admitting: Adult Health

## 2020-02-11 MED ORDER — DOXYCYCLINE HYCLATE 100 MG PO CAPS: ORAL_CAPSULE | ORAL | 0 refills | Status: DC

## 2020-10-13 ENCOUNTER — Ambulatory Visit
Admission: EM | Admit: 2020-10-13 | Discharge: 2020-10-13 | Disposition: A | Discharge status: Home / Self Care | Payer: No Typology Code available for payment source | Attending: Emergency Medicine | Admitting: Emergency Medicine

## 2020-10-13 ENCOUNTER — Other Ambulatory Visit: Payer: Self-pay

## 2020-10-13 ENCOUNTER — Encounter: Payer: Self-pay | Admitting: Emergency Medicine

## 2020-10-13 DIAGNOSIS — R509 Fever, unspecified: Secondary | ICD-10-CM | POA: Diagnosis not present

## 2020-10-13 DIAGNOSIS — Z1152 Encounter for screening for COVID-19: Secondary | ICD-10-CM | POA: Insufficient documentation

## 2020-10-13 DIAGNOSIS — J029 Acute pharyngitis, unspecified: Secondary | ICD-10-CM | POA: Diagnosis not present

## 2020-10-13 LAB — POCT RAPID STREP A (OFFICE): Rapid Strep A Screen: NEGATIVE

## 2020-10-13 MED ORDER — LIDOCAINE VISCOUS HCL 2 % MT SOLN: 15.0000 mL | OROMUCOSAL | 1 refills | Status: DC | PRN

## 2020-10-13 MED ORDER — DEXAMETHASONE 4 MG PO TABS: 4.0000 mg | ORAL_TABLET | Freq: Every day | ORAL | 0 refills | Status: AC

## 2020-10-13 NOTE — ED Provider Notes (Signed)
[facility]   [id number] 10/13/20 Arrival Time: 2409   CC: COVID symptoms  SUBJECTIVE: History from: patient.  Christina Montes is a 40 y.o. female who presented to the urgent care with a complaint of fever, sore throat and body aches for the past 3 days.  Denies sick exposure to COVID, flu or strep.  Denies recent travel.  Has tried OTC medication without t relief.  Denies alleviating or aggravating factors.  Denies previous symptoms in the past.   Denies fatigue, sinus pain, rhinorrhea, sore throat, SOB, wheezing, chest pain, nausea, changes in bowel or bladder habits.    ROS: As per HPI.  All other pertinent ROS negative.      Past Medical History:  Diagnosis Date  . Anemia   . Chronic kidney disease    1 functioning kidney, hx stones  . Heart murmur   . Kidney stones    Past Surgical History:  Procedure Laterality Date  . LITHOTRIPSY Right 1987  . surgical removal of kidney stones Right 1987   Dr. Patsi Sears  . WISDOM TOOTH EXTRACTION     No Known Allergies No current facility-administered medications on file prior to encounter.   Current Outpatient Medications on File Prior to Encounter  Medication Sig Dispense Refill  . Cholecalciferol (VITAMIN D3) 5000 units CAPS Take by mouth.    . doxycycline (VIBRAMYCIN) 100 MG capsule Take 1 capsule 2 x/day with food for 10 days. 20 capsule 0  . Multiple Vitamin (MULTIVITAMIN ADULT PO) Take 1 tablet by mouth daily.     Social History   Socioeconomic History  . Marital status: Married    Spouse name: Not on file  . Number of children: 2  . Years of education: 16  . Highest education level: Bachelor's degree (e.g., BA, AB, BS)  Occupational History  . Occupation: case management  Tobacco Use  . Smoking status: Never Smoker  . Smokeless tobacco: Never Used  Vaping Use  . Vaping Use: Never used  Substance and Sexual Activity  . Alcohol use: Yes    Comment: 2-3 per week   . Drug use: No  . Sexual activity: Yes     Partners: Male    Birth control/protection: Surgical    Comment: Husband had vasectomy  Other Topics Concern  . Not on file  Social History Narrative  . Not on file   Social Determinants of Health   Financial Resource Strain: Not on file  Food Insecurity: Not on file  Transportation Needs: Not on file  Physical Activity: Not on file  Stress: Not on file  Social Connections: Not on file  Intimate Partner Violence: Not on file   Family History  Problem Relation Age of Onset  . Melanoma Mother   . Hypertension Mother   . Hyperlipidemia Mother   . Heart disease Father   . Kidney Stones Father   . Heart attack Father 34  . Hypertension Maternal Grandmother   . Hyperlipidemia Maternal Grandmother   . Diabetes type II Paternal Grandfather     OBJECTIVE:  Vitals:   10/13/20 0852  BP: (!) 136/95  Pulse: (!) 129  Resp: 18  Temp: 99.1 F (37.3 C)  TempSrc: Oral  SpO2: 95%  Weight: 154 lb (69.9 kg)  Height: 5\' 2"  (1.575 m)     General appearance: alert; appears fatigued, but nontoxic; speaking in full sentences and tolerating own secretions HEENT: NCAT; Ears: EACs clear, TMs pearly gray; Eyes: PERRL.  EOM grossly intact. Sinuses: nontender;  Nose: nares patent without rhinorrhea, Throat: oropharynx clear, tonsils non erythematous or enlarged, uvula midline  Neck: supple without LAD Lungs: unlabored respirations, symmetrical air entry; cough: absent; no respiratory distress; CTAB Heart: regular rate and rhythm.  Radial pulses 2+ symmetrical bilaterally Skin: warm and dry Psychological: alert and cooperative; normal mood and affect  LABS:  Results for orders placed or performed during the hospital encounter of 10/13/20 (from the past 24 hour(s))  POCT rapid strep A     Status: None   Collection Time: 10/13/20  8:55 AM  Result Value Ref Range   Rapid Strep A Screen Negative Negative     ASSESSMENT & PLAN:  1. Encounter for screening for COVID-19   2. Sore throat    3. Chills with fever     Meds ordered this encounter  Medications  . dexamethasone (DECADRON) 4 MG tablet    Sig: Take 1 tablet (4 mg total) by mouth daily for 7 days.    Dispense:  7 tablet    Refill:  0  . lidocaine (XYLOCAINE) 2 % solution    Sig: Use as directed 15 mLs in the mouth or throat as needed for mouth pain.    Dispense:  100 mL    Refill:  1    Discharge instructions  COVID-19, flu A/B testing ordered.  It will take between 2-7 days for test results.  Someone will contact you regarding abnormal results.    Get plenty of rest and push fluids Use OTC throat lozenges to help soothe throat  Lidocaine mouthwash was prescribed/take as directed Decadron was prescribed use medications daily for symptom relief Use OTC medications like ibuprofen or tylenol as needed fever or pain Call or go to the ED if you have any new or worsening symptoms such as fever, worsening cough, shortness of breath, chest tightness, chest pain, turning blue, changes in mental status, etc...   Reviewed expectations re: course of current medical issues. Questions answered. Outlined signs and symptoms indicating need for more acute intervention. Patient verbalized understanding. After Visit Summary given.         Durward Parcel, FNP 10/13/20 [phone]

## 2020-10-13 NOTE — ED Triage Notes (Signed)
Sore throat, body aches and fever x 3 days.

## 2020-10-13 NOTE — Discharge Instructions (Signed)
COVID-19, flu A/B testing ordered.  It will take between 2-7 days for test results.  Someone will contact you regarding abnormal results.    Get plenty of rest and push fluids Use OTC throat lozenges to help soothe throat  Lidocaine mouthwash was prescribed/take as directed Decadron was prescribed use medications daily for symptom relief Use OTC medications like ibuprofen or tylenol as needed fever or pain Call or go to the ED if you have any new or worsening symptoms such as fever, worsening cough, shortness of breath, chest tightness, chest pain, turning blue, changes in mental status, etc..Marland Kitchen

## 2020-10-16 LAB — CULTURE, GROUP A STREP (THRC)

## 2020-10-16 LAB — COVID-19, FLU A+B NAA
Influenza A, NAA: NOT DETECTED
Influenza B, NAA: NOT DETECTED
SARS-CoV-2, NAA: DETECTED — AB

## 2020-12-16 ENCOUNTER — Encounter: Payer: Self-pay | Admitting: Adult Health

## 2020-12-16 NOTE — Progress Notes (Signed)
Complete Physical  Assessment and Plan:   Routine general medical examination at a health care facility Health Maintenance- Discussed STD testing, safe sex, alcohol and drug awareness, drinking and driving dangers, wearing a seat belt and general safety measures for young adult.  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   Screening for hematuria or proteinuria -     Urinalysis w microscopic + reflex cultur -     Microalbumin / creatinine urine ratio  Vitamin D deficiency -     VITAMIN D 25 Hydroxy (Vit-D Deficiency, Fractures)  Medication management -     CBC with Differential/Platelet -     CMP/GFR -     Magnesium   Sensation of skipped heart beats Improved with caffeine reduction; discussed stress management Had recent normal EKG, hx of normal Holter Continue lifestyle modification; follow up if persisting, progressive or with any new accompaniments  Screening cholesterol        -      Lipid panel   Overweight Long discussion about weight loss, diet, and exercise Recommended diet heavy in fruits and veggies and low in animal meats, cheeses, and dairy products, appropriate calorie intake Discussed appropriate weight for height and initial goal (<150 lb) Follow up at next visit  Orders Placed This Encounter  Procedures  . CBC with Differential/Platelet  . COMPLETE METABOLIC PANEL WITH GFR  . Magnesium  . Lipid panel  . TSH  . VITAMIN D 25 Hydroxy (Vit-D Deficiency, Fractures)  . Microalbumin / creatinine urine ratio  . Urinalysis, Routine w reflex microscopic  . Vitamin B12     Discussed med's effects and SE's. Screening labs and tests as requested with regular follow-up as recommended. Over 40 minutes of exam,  counseling, chart review and critical decision making was performed  Future Appointments  Date Time Provider Department Center  12/19/2021  9:00 AM Judd Gaudier, NP [facility] None     HPI  BP 120/76   Pulse (!) 59   Temp (!) 97.5 F (36.4 C)   Ht 5\' 3"  (1.6 m)   Wt 162 lb (73.5 kg)   SpO2 99%   BMI 28.70 kg/m   This very nice 40 y.o.female presents for complete physical.  She has Vitamin D deficiency; Nonfunctioning kidney; and Overweight (BMI 25.0-29.9) on their problem list.   Patient reports no complaints at this time.   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 Boys are back in school.   She is seeing obygn. Dr. Cabin crew at [facility]. She has had normal pap smears.  Periods are normal.  Husband had a vasectomy.   She has a family hx of melanoma and has been referred to dermatology (Dr. Renaldo Fiddler) for annual screening; she continues to follow up annually as recommended.   BMI is Body mass index is 28.7 kg/m., she has been working on diet and exercise. She walks for 1 hour 3 days a week with kids. She drinks 1 cup of coffee daily. Doesn't drink sweet tea or soda. Estimates 90 fluid ounces of water daily. She does estimate 5-7 servings of fruits/vegetables daily, tries to do lower carb, if does grain typically brown rice. 2 servings  of red meat weekly. Sleeps well.  Wt Readings from Last 3 Encounters:  12/17/20 162 lb (73.5 kg)  10/13/20 154 lb (69.9 kg)  12/17/19 152 lb (68.9 kg)   Today their BP is BP: 120/76  She does workout. She denies chest pain, shortness of breath, dizziness.   She reported sensation of skipped heart beats, no accompaniments, cut down on caffeine and much improved, minimal recently. Did have normal labs and EKG after.   She is not on cholesterol medication.The cholesterol last visit was:   Lab Results  Component Value Date   CHOL 177 12/10/2017   HDL 86 12/10/2017   LDLCALC 77 12/10/2017   TRIG 63 12/10/2017    CHOLHDL 2.1 12/10/2017   Last J6E in the office was:  Lab Results  Component Value Date   HGBA1C 5.1 12/10/2017    She has hx of non-functioning kidney as child, established with [facility] as needed, recently with stable renal function:  Lab Results  Component Value Date   GFRNONAA 86 12/17/2019   She takes B12 supplement intermittently.  Lab Results  Component Value Date   VITAMINB12 384 12/17/2019   Finally, patient has history of Vitamin D Deficiency and last vitamin D was  Lab Results  Component Value Date   VD25OH 54 12/17/2019  Currently on supplementation taking 5000 IU daily.     Current Medications:  Current Outpatient Medications on File Prior to Visit  Medication Sig Dispense Refill  . Cholecalciferol (VITAMIN D3) 5000 units CAPS Take by mouth.    . doxycycline (VIBRAMYCIN) 100 MG capsule Take 1 capsule 2 x/day with food for 10 days. 20 capsule 0  . lidocaine (XYLOCAINE) 2 % solution Use as directed 15 mLs in the mouth or throat as needed for mouth pain. 100 mL 1  . Multiple Vitamin (MULTIVITAMIN ADULT PO) Take 1 tablet by mouth daily.     No current facility-administered medications on file prior to visit.   Health Maintenance:   There is no immunization history for the selected administration types on file for this patient.   TD/TDAP: 2015 Influenza: Declines Pneumovax: n/a  Covid 19: 2/2, pfizer summer 2021 - will send info  Sexually Active: yes STD testing offered, declined Pap: 12/2017 - OBGYN Dr. Renaldo Fiddler, has scheduled 01/2021 MGM:  -   Eye exam: My eye MD in [place], Wears glasses for distance, contacts, 08/2019 Dentist: Dr. Lequita Asal, q6 months, last visit 09/2020 Dermatology: Dr. Sharyn Lull, goes annually, last visit 2021, has scheduled in July 2022  Allergies: No Known Allergies Medical History:  has Vitamin D deficiency; Nonfunctioning kidney; and Overweight (BMI 25.0-29.9) on their problem list. Surgical History:  She  has a past  surgical history that includes Lithotripsy (Right, 1987); surgical removal of kidney stones (Right, 1987); and Wisdom tooth extraction. Family History:  Her family history includes Diabetes type II in her paternal grandfather; Heart attack (age of onset: 58) in her father; Heart disease in her father; Hyperlipidemia in her maternal grandmother and mother; Hypertension in her maternal grandmother and mother; Kidney Stones in her father; Melanoma in her mother. Social History:   reports that she has never smoked. She has never used smokeless tobacco. She reports current alcohol use. She reports that she does not use drugs.  Review of Systems: Review of Systems  Constitutional: Negative for malaise/fatigue and weight loss.  HENT: Negative for hearing loss and tinnitus.   Eyes: Negative for blurred vision and double vision.  Respiratory: Negative for cough,  sputum production, shortness of breath and wheezing.   Cardiovascular: Negative for chest pain, palpitations, orthopnea, claudication, leg swelling and PND.  Gastrointestinal: Negative for abdominal pain, blood in stool, constipation, diarrhea, heartburn, melena, nausea and vomiting.  Genitourinary: Negative.   Musculoskeletal: Negative for falls, joint pain and myalgias.  Skin: Negative for rash.  Neurological: Negative for dizziness, tingling, sensory change, weakness and headaches.  Endo/Heme/Allergies: Negative for polydipsia.  Psychiatric/Behavioral: Negative.  Negative for depression, memory loss, substance abuse and suicidal ideas. The patient is not nervous/anxious and does not have insomnia.   All other systems reviewed and are negative.   Physical Exam: Estimated body mass index is 28.7 kg/m as calculated from the following:   Height as of this encounter: 5\' 3"  (1.6 m).   Weight as of this encounter: 162 lb (73.5 kg). BP 120/76   Pulse (!) 59   Temp (!) 97.5 F (36.4 C)   Ht 5\' 3"  (1.6 m)   Wt 162 lb (73.5 kg)   SpO2 99%    BMI 28.70 kg/m  General Appearance: Well nourished, in no apparent distress.  Eyes: PERRLA, EOMs, conjunctiva no swelling or erythema Sinuses: No Frontal/maxillary tenderness  ENT/Mouth: Ext aud canals clear, normal light reflex with TMs without erythema, bulging. Good dentition. No erythema, swelling, or exudate on post pharynx. Tonsils not swollen or erythematous. Hearing normal.  Neck: Supple, thyroid normal. No bruits  Respiratory: Respiratory effort normal, BS equal bilaterally without rales, rhonchi, wheezing or stridor.  Cardio: RRR without murmurs, rubs or gallops. Brisk peripheral pulses without edema.  Chest: symmetric, with normal excursions and percussion.  Breasts: Defer to GYN Abdomen: Soft, nontender, no guarding, rebound, hernias, masses, or organomegaly.  Lymphatics: Non tender without lymphadenopathy.  Genitourinary: Defer to GYN Musculoskeletal: Full ROM all peripheral extremities,5/5 strength, and normal gait.  Skin: Warm, dry without rashes, lesions, ecchymosis. Neuro: Cranial nerves intact, reflexes equal bilaterally. Normal muscle tone, no cerebellar symptoms. Sensation intact.  Psych: Awake and oriented X 3, normal affect, Insight and Judgment appropriate.   EKG: NSR, NSCPT in 2020, no concerns, defer  9:45 AM [facility]

## 2020-12-17 ENCOUNTER — Ambulatory Visit (INDEPENDENT_AMBULATORY_CARE_PROVIDER_SITE_OTHER): Payer: No Typology Code available for payment source | Admitting: Adult Health

## 2020-12-17 ENCOUNTER — Encounter: Payer: Self-pay | Admitting: Adult Health

## 2020-12-17 ENCOUNTER — Other Ambulatory Visit: Payer: Self-pay

## 2020-12-17 VITALS — BP 120/76 | HR 59 | Temp 97.5°F | Ht 63.0 in | Wt 162.0 lb

## 2020-12-17 DIAGNOSIS — Z1329 Encounter for screening for other suspected endocrine disorder: Secondary | ICD-10-CM

## 2020-12-17 DIAGNOSIS — E559 Vitamin D deficiency, unspecified: Secondary | ICD-10-CM

## 2020-12-17 DIAGNOSIS — Z0000 Encounter for general adult medical examination without abnormal findings: Secondary | ICD-10-CM

## 2020-12-17 DIAGNOSIS — Z13220 Encounter for screening for lipoid disorders: Secondary | ICD-10-CM

## 2020-12-17 DIAGNOSIS — Z8349 Family history of other endocrine, nutritional and metabolic diseases: Secondary | ICD-10-CM

## 2020-12-17 DIAGNOSIS — N289 Disorder of kidney and ureter, unspecified: Secondary | ICD-10-CM

## 2020-12-17 DIAGNOSIS — Z1389 Encounter for screening for other disorder: Secondary | ICD-10-CM

## 2020-12-17 DIAGNOSIS — R635 Abnormal weight gain: Secondary | ICD-10-CM

## 2020-12-17 DIAGNOSIS — E663 Overweight: Secondary | ICD-10-CM

## 2020-12-17 DIAGNOSIS — E538 Deficiency of other specified B group vitamins: Secondary | ICD-10-CM

## 2020-12-17 DIAGNOSIS — Z79899 Other long term (current) drug therapy: Secondary | ICD-10-CM

## 2020-12-17 NOTE — Patient Instructions (Addendum)
Ms. Cambre , Thank you for taking time to come for your Medicare Wellness Visit. I appreciate your ongoing commitment to your health goals. Please review the following plan we discussed and let me know if I can assist you in the future.   These are the goals we discussed: Goals    . DIET - EAT MORE FRUITS AND VEGETABLES     7+ servings (1/2 cup each) daily     . Weight (lb) < 150 lb (68 kg)       This is a list of the screening recommended for you and due dates:  Health Maintenance  Topic Date Due  . Pap Smear  11/24/2019  . Flu Shot  02/02/2021*  . Tetanus Vaccine  09/26/2023  . HIV Screening  Completed  . HPV Vaccine  Aged Out  .  Hepatitis C: One time screening is recommended by Center for Disease Control  (CDC) for  adults born from [birth year] through 1965.   Discontinued  *Topic was postponed. The date shown is not the original due date.      Know what a healthy weight is for you (roughly BMI <25) and aim to maintain this  Aim for 7+ servings of fruits and vegetables daily (try to eat wide variety, mix up your selections regularly)  65-80+ fluid ounces of water or unsweet tea for healthy kidneys  Limit to max 1 drink of alcohol per day; avoid smoking/tobacco  Limit animal fats in diet for cholesterol and heart health - choose grass fed whenever available  Avoid highly processed foods, and foods high in saturated/trans fats  Aim for low stress - take time to unwind and care for your mental health  Aim for 150 min of moderate intensity exercise weekly for heart health, and weights twice weekly for bone health  Aim for 7-9 hours of sleep daily    High-Fiber Eating Plan Fiber, also called dietary fiber, is a type of carbohydrate. It is found foods such as fruits, vegetables, whole grains, and beans. A high-fiber diet can have many health benefits. Your health care provider may recommend a high-fiber diet to help:  Prevent constipation. Fiber can make your bowel movements  more regular.  Lower your cholesterol.  Relieve the following conditions: ? Inflammation of veins in the anus (hemorrhoids). ? Inflammation of specific areas of the digestive tract (uncomplicated diverticulosis). ? A problem of the large intestine, also called the colon, that sometimes causes pain and diarrhea (irritable bowel syndrome, or IBS).  Prevent overeating as part of a weight-loss plan.  Prevent heart disease, type 2 diabetes, and certain cancers. What are tips for following this plan? Reading food labels  Check the nutrition facts label on food products for the amount of dietary fiber. Choose foods that have 5 grams of fiber or more per serving.  The goals for recommended daily fiber intake include: ? Men (age 63 or younger): 34-38 g. ? Men (over age 24): 28-34 g. ? Women (age 11 or younger): 25-28 g. ? Women (over age 43): 22-25 g. Your daily fiber goal is _____________ g.   Shopping  Choose whole fruits and vegetables instead of processed forms, such as apple juice or applesauce.  Choose a wide variety of high-fiber foods such as avocados, lentils, oats, and kidney beans.  Read the nutrition facts label of the foods you choose. Be aware of foods with added fiber. These foods often have high sugar and sodium amounts per serving. Cooking  Use whole-grain flour  for baking and cooking.  Cook with brown rice instead of white rice. Meal planning  Start the day with a breakfast that is high in fiber, such as a cereal that contains 5 g of fiber or more per serving.  Eat breads and cereals that are made with whole-grain flour instead of refined flour or white flour.  Eat brown rice, bulgur wheat, or millet instead of white rice.  Use beans in place of meat in soups, salads, and pasta dishes.  Be sure that half of the grains you eat each day are whole grains. General information  You can get the recommended daily intake of dietary fiber by: ? Eating a variety of  fruits, vegetables, grains, nuts, and beans. ? Taking a fiber supplement if you are not able to take in enough fiber in your diet. It is better to get fiber through food than from a supplement.  Gradually increase how much fiber you consume. If you increase your intake of dietary fiber too quickly, you may have bloating, cramping, or gas.  Drink plenty of water to help you digest fiber.  Choose high-fiber snacks, such as berries, raw vegetables, nuts, and popcorn. What foods should I eat? Fruits Berries. Pears. Apples. Oranges. Avocado. Prunes and raisins. Dried figs. Vegetables Sweet potatoes. Spinach. Kale. Artichokes. Cabbage. Broccoli. Cauliflower. Green peas. Carrots. Squash. Grains Whole-grain breads. Multigrain cereal. Oats and oatmeal. Brown rice. Barley. Bulgur wheat. Millet. Quinoa. Bran muffins. Popcorn. Rye wafer crackers. Meats and other proteins Navy beans, kidney beans, and pinto beans. Soybeans. Split peas. Lentils. Nuts and seeds. Dairy Fiber-fortified yogurt. Beverages Fiber-fortified soy milk. Fiber-fortified orange juice. Other foods Fiber bars. The items listed above may not be a complete list of recommended foods and beverages. Contact a dietitian for more information. What foods should I avoid? Fruits Fruit juice. Cooked, strained fruit. Vegetables Fried potatoes. Canned vegetables. Well-cooked vegetables. Grains White bread. Pasta made with refined flour. White rice. Meats and other proteins Fatty cuts of meat. Fried chicken or fried fish. Dairy Milk. Yogurt. Cream cheese. Sour cream. Fats and oils Butters. Beverages Soft drinks. Other foods Cakes and pastries. The items listed above may not be a complete list of foods and beverages to avoid. Talk with your dietitian about what choices are best for you. Summary  Fiber is a type of carbohydrate. It is found in foods such as fruits, vegetables, whole grains, and beans.  A high-fiber diet has many  benefits. It can help to prevent constipation, lower blood cholesterol, aid weight loss, and reduce your risk of heart disease, diabetes, and certain cancers.  Increase your intake of fiber gradually. Increasing fiber too quickly may cause cramping, bloating, and gas. Drink plenty of water while you increase the amount of fiber you consume.  The best sources of fiber include whole fruits and vegetables, whole grains, nuts, seeds, and beans. This information is not intended to replace advice given to you by your health care provider. Make sure you discuss any questions you have with your health care provider. Document Revised: 01/15/2020 Document Reviewed: 01/15/2020 Elsevier Patient Education  2021 [employer].

## 2020-12-18 ENCOUNTER — Encounter: Payer: Self-pay | Admitting: Adult Health

## 2020-12-18 DIAGNOSIS — E538 Deficiency of other specified B group vitamins: Secondary | ICD-10-CM | POA: Insufficient documentation

## 2020-12-18 LAB — COMPLETE METABOLIC PANEL WITH GFR
AG Ratio: 1.9 (calc) (ref 1.0–2.5)
ALT: 7 U/L (ref 6–29)
AST: 13 U/L (ref 10–30)
Albumin: 4.4 g/dL (ref 3.6–5.1)
Alkaline phosphatase (APISO): 44 U/L (ref 31–125)
BUN: 13 mg/dL (ref 7–25)
CO2: 27 mmol/L (ref 20–32)
Calcium: 9.3 mg/dL (ref 8.6–10.2)
Chloride: 102 mmol/L (ref 98–110)
Creat: 0.75 mg/dL (ref 0.50–1.10)
GFR, Est African American: 116 mL/min/{1.73_m2} (ref >=60)
GFR, Est Non African American: 100 mL/min/{1.73_m2} (ref >=60)
Globulin: 2.3 g/dL (calc) (ref 1.9–3.7)
Glucose, Bld: 79 mg/dL (ref 65–99)
Potassium: 4.1 mmol/L (ref 3.5–5.3)
Sodium: 138 mmol/L (ref 135–146)
Total Bilirubin: 0.6 mg/dL (ref 0.2–1.2)
Total Protein: 6.7 g/dL (ref 6.1–8.1)

## 2020-12-18 LAB — URINALYSIS, ROUTINE W REFLEX MICROSCOPIC
Bilirubin Urine: NEGATIVE
Glucose, UA: NEGATIVE
Hgb urine dipstick: NEGATIVE
Ketones, ur: NEGATIVE
Leukocytes,Ua: NEGATIVE
Nitrite: NEGATIVE
Protein, ur: NEGATIVE
Specific Gravity, Urine: 1.006 (ref 1.001–1.03)
pH: 8 (ref 5.0–8.0)

## 2020-12-18 LAB — CBC WITH DIFFERENTIAL/PLATELET
Absolute Monocytes: 379 cells/uL (ref 200–950)
Basophils Absolute: 48 cells/uL (ref 0–200)
Basophils Relative: 1 %
Eosinophils Absolute: 240 cells/uL (ref 15–500)
Eosinophils Relative: 5 %
HCT: 41.1 % (ref 35.0–45.0)
Hemoglobin: 13.6 g/dL (ref 11.7–15.5)
Lymphs Abs: 1186 cells/uL (ref 850–3900)
MCH: 30.2 pg (ref 27.0–33.0)
MCHC: 33.1 g/dL (ref 32.0–36.0)
MCV: 91.1 fL (ref 80.0–100.0)
MPV: 9.8 fL (ref 7.5–12.5)
Monocytes Relative: 7.9 %
Neutro Abs: 2947 cells/uL (ref 1500–7800)
Neutrophils Relative %: 61.4 %
Platelets: 325 10*3/uL (ref 140–400)
RBC: 4.51 10*6/uL (ref 3.80–5.10)
RDW: 11.8 % (ref 11.0–15.0)
Total Lymphocyte: 24.7 %
WBC: 4.8 10*3/uL (ref 3.8–10.8)

## 2020-12-18 LAB — LIPID PANEL
Cholesterol: 185 mg/dL (ref <200)
HDL: 78 mg/dL (ref >=50)
LDL Cholesterol (Calc): 94 mg/dL (calc)
Non-HDL Cholesterol (Calc): 107 mg/dL (calc) (ref <130)
Total CHOL/HDL Ratio: 2.4 (calc) (ref <5.0)
Triglycerides: 52 mg/dL (ref <150)

## 2020-12-18 LAB — VITAMIN D 25 HYDROXY (VIT D DEFICIENCY, FRACTURES): Vit D, 25-Hydroxy: 59 ng/mL (ref 30–100)

## 2020-12-18 LAB — TSH: TSH: 2 mIU/L

## 2020-12-18 LAB — VITAMIN B12: Vitamin B-12: 396 pg/mL (ref 200–1100)

## 2020-12-18 LAB — MICROALBUMIN / CREATININE URINE RATIO
Creatinine, Urine: 19 mg/dL — ABNORMAL LOW (ref 20–275)
Microalb, Ur: <0.2 mg/dL

## 2020-12-18 LAB — MAGNESIUM: Magnesium: 2.1 mg/dL (ref 1.5–2.5)

## 2021-02-16 LAB — HM MAMMOGRAPHY

## 2021-02-16 LAB — RESULTS CONSOLE HPV: CHL HPV: NEGATIVE

## 2021-02-16 LAB — HM PAP SMEAR: HM Pap smear: NORMAL

## 2021-12-19 ENCOUNTER — Encounter: Payer: No Typology Code available for payment source | Admitting: Adult Health

## 2021-12-30 NOTE — Progress Notes (Deleted)
Complete Physical ? ?Assessment and Plan: ? ? ?Encounter for Annual Physical Exam with abnormal findings ?Due annually  ?Health Maintenance reviewed ?Healthy lifestyle reviewed and goals set ?Follow up GYN for PAP, report requested *** ? ?Screening for thyroid disorder/ family history of thyroid (twin) ?-     TSH ? 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  ? ?Screening for hematuria or proteinuria ?-     Urinalysis w microscopic + reflex cultur ?-     Microalbumin / creatinine urine ratio ? ?Screening for deficiency anemia ?-     B12  ? ?Vitamin D deficiency ?-     VITAMIN D 25 Hydroxy (Vit-D Deficiency, Fractures) ? ?Medication management ?-     CBC with Differential/Platelet ?-     CMP/GFR ?-     Magnesium  ? ?Sensation of skipped heart beats ?Improved with caffeine reduction; discussed stress management ?Had recent normal EKG, hx of normal Holter ?Continue lifestyle modification; follow up if persisting, progressive or with any new accompaniments ? ?Screening cholesterol  ?      -      Lipid panel  ? ?Overweight - BMI *** ?Long discussion about weight loss, diet, and exercise ?Recommended diet heavy in fruits and veggies and low in animal meats, cheeses, and dairy products, appropriate calorie intake ?Discussed appropriate weight for height and initial goal (<150 lb) ?Follow up at next visit ? ?No orders of the defined types were placed in this encounter. ? ? ? ?Discussed med's effects and SE's. Screening labs and tests as requested with regular follow-up as recommended. ?Over 40 minutes of exam, counseling, chart review and critical decision making was performed ? ?Future Appointments  ?Date Time Provider [place]  ?01/03/2022  9:00 AM Liane Comber, NP [facility] None  ? ? ? ?HPI  ?There were no vitals taken for this visit. ? ?This very nice 41 y.o.female presents for complete physical.  She has Vitamin D  deficiency; Nonfunctioning kidney; Overweight (BMI 25.0-29.9); and B12 deficiency on their problem list.  ? ?Patient reports no complaints at this time.  ? 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  ? ?She is seeing obygn Dr. Julien Girt at [facility]. She has had normal pap smears.  Periods are normal.  Husband had a vasectomy.  ? ?She has a family hx of melanoma and has been referred to dermatology (Dr. Renda Rolls) for annual screening; she continues to follow up annually as recommended.  ? ?BMI is There is no height or weight on file to calculate BMI., she has been working on diet and exercise. She walks for 1 hour 3 days a week with kids. She drinks 1 cup of coffee daily. Doesn't drink sweet tea or soda. Estimates 90 fluid ounces of water daily. She does estimate 5-7 servings of fruits/vegetables daily, tries to do lower carb, if does grain typically brown rice. 2 servings of red meat weekly. Sleeps well.  ?Wt Readings from Last 3 Encounters:  ?12/17/20 162 lb (73.5 kg)  ?10/13/20 154 lb (69.9 kg)  ?12/17/19 152 lb (68.9 kg)  ? ?Today their BP is   ? She does workout. She denies chest pain, shortness of breath, dizziness.  ? ?Hx of palpitations much improved after reducing caffeine.  ? ? She is not on cholesterol medication.The cholesterol last visit was:   ?Lab Results  ?Component Value  Date  ? CHOL 185 12/17/2020  ? HDL 78 12/17/2020  ? [place] 94 12/17/2020  ? TRIG 52 12/17/2020  ? CHOLHDL 2.4 12/17/2020  ? ?Last A1C in the office was:  ?Lab Results  ?Component Value Date  ? HGBA1C 5.1 12/10/2017  ? ? She has hx of non-functioning kidney as child, established with [facility] as needed, recently with stable renal function:  ?Lab Results  ?Component Value Date  ? GFRNONAA 100 12/17/2020  ? ?She takes B12 supplement intermittently.  ?Lab Results  ?Component Value Date  ? [id number] 12/17/2020  ? ?Finally, patient has history of Vitamin D Deficiency  and last vitamin D was  ?Lab Results  ?Component Value Date  ? VD25OH 59 12/17/2020  ?Currently on supplementation taking 5000 IU daily.  ?  ? ?Current Medications:  ?Current Outpatient Medications on File Prior to Visit  ?Medication Sig Dispense Refill  ? Cholecalciferol (VITAMIN D3) 5000 units CAPS Take by mouth.    ? doxycycline (VIBRAMYCIN) 100 MG capsule Take 1 capsule 2 x/day with food for 10 days. 20 capsule 0  ? lidocaine (XYLOCAINE) 2 % solution Use as directed 15 mLs in the mouth or throat as needed for mouth pain. 100 mL 1  ? Multiple Vitamin (MULTIVITAMIN ADULT PO) Take 1 tablet by mouth daily.    ? ?No current facility-administered medications on file prior to visit.  ? ?Health Maintenance:   ?There is no immunization history for the selected administration types on file for this patient.  ? ?Health Maintenance  ?Topic Date Due  ? PAP SMEAR-Modifier  11/24/2019  ? INFLUENZA VACCINE  04/25/2022  ? TETANUS/TDAP  09/26/2023  ? HIV Screening  Completed  ? HPV VACCINES  Aged Out  ? Hepatitis C Screening  Discontinued  ? ?Influenza: Declines ?Covid 19: 2/2, pfizer summer 2021 - will send info *** ? ?Sexually Active: yes STD testing offered, declined ?Pap: 5/35/2022 - OBGYN Dr. Julien Girt ?MGM: 02/16/2021 - OBGYN Dr. Julien Girt ? ?Eye exam: My eye MD in [place], Wears glasses for distance, contacts, 08/2019 ?Dentist: Dr. Alpha Gula, q6 months, last visit 09/2020 ?Dermatology: Dr. Renda Rolls, goes annually, last visit 2021, has scheduled in July 2022 ? ?Allergies: No Known Allergies ?Medical History:  ?has Vitamin D deficiency; Nonfunctioning kidney; Overweight (BMI 25.0-29.9); and B12 deficiency on their problem list. ?Surgical History:  ?She  has a past surgical history that includes Lithotripsy (Right, 1987); surgical removal of kidney stones (Right, 1987); and Wisdom tooth extraction. ?Family History:  ?Her family history includes Diabetes type II in her paternal grandfather; Heart attack (age of onset: 50) in her  father; Heart disease in her father; Hyperlipidemia in her maternal grandmother and mother; Hypertension in her maternal grandmother and mother; Hypothyroidism in her sister; Kidney Stones in her father; Melanoma in her mother. ?Social History:  ? reports that she has never smoked. She has never used smokeless tobacco. She reports current alcohol use. She reports that she does not use drugs. ? ?Review of Systems: ?Review of Systems  ?Constitutional:  Negative for malaise/fatigue and weight loss.  ?HENT:  Negative for hearing loss and tinnitus.   ?Eyes:  Negative for blurred vision and double vision.  ?Respiratory:  Negative for cough, sputum production, shortness of breath and wheezing.   ?Cardiovascular:  Negative for chest pain, palpitations, orthopnea, claudication, leg swelling and PND.  ?Gastrointestinal:  Negative for abdominal pain, blood in stool, constipation, diarrhea, heartburn, melena, nausea and vomiting.  ?Genitourinary: Negative.   ?Musculoskeletal:  Negative  for falls, joint pain and myalgias.  ?Skin:  Negative for rash.  ?Neurological:  Negative for dizziness, tingling, sensory change, weakness and headaches.  ?Endo/Heme/Allergies:  Negative for polydipsia.  ?Psychiatric/Behavioral: Negative.  Negative for depression, memory loss, substance abuse and suicidal ideas. The patient is not nervous/anxious and does not have insomnia.   ?All other systems reviewed and are negative. ? ?Physical Exam: ?Estimated body mass index is 28.7 kg/m? as calculated from the following: ?  Height as of 12/17/20: 5\' 3"  (1.6 m). ?  Weight as of 12/17/20: 162 lb (73.5 kg). ?There were no vitals taken for this visit. ?General Appearance: Well nourished, in no apparent distress.  ?Eyes: PERRLA, EOMs, conjunctiva no swelling or erythema ?Sinuses: No Frontal/maxillary tenderness  ?ENT/Mouth: Ext aud canals clear, normal light reflex with TMs without erythema, bulging. Good dentition. No erythema, swelling, or exudate on post  pharynx. Tonsils not swollen or erythematous. Hearing normal.  ?Neck: Supple, thyroid normal. No bruits  ?Respiratory: Respiratory effort normal, BS equal bilaterally without rales, rhonchi, wheezing or stridor.

## 2022-01-03 ENCOUNTER — Encounter: Payer: No Typology Code available for payment source | Admitting: Adult Health

## 2022-01-03 DIAGNOSIS — Z79899 Other long term (current) drug therapy: Secondary | ICD-10-CM

## 2022-01-03 DIAGNOSIS — E663 Overweight: Secondary | ICD-10-CM

## 2022-01-03 DIAGNOSIS — E538 Deficiency of other specified B group vitamins: Secondary | ICD-10-CM

## 2022-01-03 DIAGNOSIS — E559 Vitamin D deficiency, unspecified: Secondary | ICD-10-CM

## 2022-01-03 DIAGNOSIS — Z1389 Encounter for screening for other disorder: Secondary | ICD-10-CM

## 2022-01-03 DIAGNOSIS — N289 Disorder of kidney and ureter, unspecified: Secondary | ICD-10-CM

## 2022-01-03 DIAGNOSIS — Z8349 Family history of other endocrine, nutritional and metabolic diseases: Secondary | ICD-10-CM

## 2022-01-03 DIAGNOSIS — Z1329 Encounter for screening for other suspected endocrine disorder: Secondary | ICD-10-CM

## 2022-01-03 DIAGNOSIS — Z0001 Encounter for general adult medical examination with abnormal findings: Secondary | ICD-10-CM

## 2022-02-01 ENCOUNTER — Encounter: Payer: Self-pay | Admitting: Adult Health

## 2022-02-01 ENCOUNTER — Ambulatory Visit (INDEPENDENT_AMBULATORY_CARE_PROVIDER_SITE_OTHER): Payer: No Typology Code available for payment source | Admitting: Adult Health

## 2022-02-01 VITALS — BP 118/80 | HR 72 | Temp 97.9°F | Ht 63.0 in | Wt 161.2 lb

## 2022-02-01 DIAGNOSIS — Z1389 Encounter for screening for other disorder: Secondary | ICD-10-CM | POA: Diagnosis not present

## 2022-02-01 DIAGNOSIS — Z79899 Other long term (current) drug therapy: Secondary | ICD-10-CM | POA: Diagnosis not present

## 2022-02-01 DIAGNOSIS — E663 Overweight: Secondary | ICD-10-CM

## 2022-02-01 DIAGNOSIS — Z130 Encounter for screening for diseases of the blood and blood-forming organs and certain disorders involving the immune mechanism: Secondary | ICD-10-CM | POA: Diagnosis not present

## 2022-02-01 DIAGNOSIS — Z0000 Encounter for general adult medical examination without abnormal findings: Secondary | ICD-10-CM

## 2022-02-01 DIAGNOSIS — Z0001 Encounter for general adult medical examination with abnormal findings: Secondary | ICD-10-CM

## 2022-02-01 DIAGNOSIS — E559 Vitamin D deficiency, unspecified: Secondary | ICD-10-CM | POA: Diagnosis not present

## 2022-02-01 DIAGNOSIS — E538 Deficiency of other specified B group vitamins: Secondary | ICD-10-CM

## 2022-02-01 DIAGNOSIS — N289 Disorder of kidney and ureter, unspecified: Secondary | ICD-10-CM

## 2022-02-01 DIAGNOSIS — Z13220 Encounter for screening for lipoid disorders: Secondary | ICD-10-CM

## 2022-02-01 DIAGNOSIS — Z1329 Encounter for screening for other suspected endocrine disorder: Secondary | ICD-10-CM

## 2022-02-01 DIAGNOSIS — Z8349 Family history of other endocrine, nutritional and metabolic diseases: Secondary | ICD-10-CM

## 2022-02-01 NOTE — Patient Instructions (Addendum)
?Ms. Montes , ?Thank you for taking time to come for your Annual Wellness Visit. I appreciate your ongoing commitment to your health goals. Please review the following plan we discussed and let me know if I can assist you in the future.  ? ?These are the goals we discussed: ? Goals   ? ?  DIET - EAT MORE FRUITS AND VEGETABLES   ?  7+ servings (1/2 cup each) daily  ? ?  ?  Exercise 150 min/wk Moderate Activity   ?  Incorporate resistance exercises such as squats, resistance bands, inclined walking to help with strength  ?  ?  Weight (lb) < 150 lb (68 kg)   ? ?  ?  ?This is a list of the screening recommended for you and due dates:  ?Health Maintenance  ?Topic Date Due  ? Pap Smear  11/24/2019  ? Flu Shot  04/25/2022  ? Tetanus Vaccine  09/26/2023  ? HIV Screening  Completed  ? HPV Vaccine  Aged Out  ? Hepatitis C Screening: USPSTF Recommendation to screen - Ages 47-79 yo.  Discontinued  ? ? ?Suggest focusing on protein inake (20-30 g/meal) and resistance exercises or inclined walking - new study on metabolism in women really concluded muscle mass = metabolism ? ? ? ?Know what a healthy weight is for you (roughly BMI <25) and aim to maintain this ? ?Aim for 7+ servings of fruits and vegetables daily ? ?65-80+ fluid ounces of water or unsweet tea for healthy kidneys ? ?Limit to max 1 drink of alcohol per day; avoid smoking/tobacco ? ?Limit animal fats in diet for cholesterol and heart health - choose grass fed whenever available ? ?Avoid highly processed foods, and foods high in saturated/trans fats ? ?Aim for low stress - take time to unwind and care for your mental health ? ?Aim for 150 min of moderate intensity exercise weekly for heart health, and weights twice weekly for bone health ? ?Aim for 7-9 hours of sleep daily ? ? ? ? ? ?Protein Content in Foods ?Protein is a necessary nutrient in any diet. It helps build and repair muscles, bones, and skin. Depending on your overall health, you may need more or less  protein in your diet. You are encouraged to eat a variety of protein foods to ensure that you get all the essential nutrients that are found in different protein foods. Talk with your health care provider or dietitian about how much protein you need each day and which sources of protein are best for you. ?Protein is especially important for: ?Repairing and making cells and tissues. ?Fighting infection. ?Providing energy. ?Growth and development. ?See the following list for the protein content of some common foods. ?What are tips for getting more protein in your diet? ?Try to replace processed carbohydrates with high-quality protein. ?Snack on nuts and seeds instead of chips. ?Replace baked desserts with [place] yogurt. ?Eat protein foods from both plant and animal sources. ?Replace red meat with seafood. ?Add beans and peas to salads, soups, and side dishes. ?Include a protein food with each meal and snack. ?Reading food labels ?You can find the amount of protein in a food item by looking at the nutrition facts label. Use the total grams listed to help you reach your daily goal. ?What foods are high in protein? ? ?High-protein foods contain 4 grams (g) or more of protein per serving. They include: ?Grains ?Quinoa (cooked) -- 1 cup (185 g) has 8 g of protein. ?Whole wheat  pasta (cooked) -- 1 cup (140 g) has 6 g of protein. ?Meat ?Beef, ground sirloin (cooked) -- 3 oz (85 g) has 24 g of protein. ?Chicken breast, boneless and skinless (cooked) -- 3 oz (85 g) has 25 g of protein. ?Egg -- 1 egg has 6 g of protein. ?Fish, filet (cooked) -- 1 oz (28 g) has 6-7 g of protein. ?Lamb (cooked) -- 3 oz (85 g) has 24 g of protein. ?Pork tenderloin (cooked) -- 3 oz (85 g) has 23 g of protein. ?Tuna (canned in water) -- 3 oz (85 g) has 20 g of protein. ?Dairy ?Cottage cheese -- ? cup (114 g) has 13.4 g of protein. ?Milk -- 1 cup (237 mL) has 8 g of protein. ?Cheese (hard) -- 1 oz (28 g) has 7 g of protein. ?Yogurt, regular -- 6 oz  (170 g) has 8 g of protein. ?[place] yogurt -- 6 oz (200 g) has 18 g protein. ?Plant protein ?Garbanzo beans (canned or cooked) -- ? cup (130 g) has 6-7 g of protein. ?Kidney beans (canned or cooked) -- ? cup (130 g) has 6-7 g of protein. ?Nuts (peanuts, pistachios, almonds) -- 1 oz (28 g) has 6 g of protein. ?Peanut butter -- 1 oz (32 g) has 7-8 g of protein. ?Pumpkin seeds -- 1 oz (28 g) has 8.5 g of protein. ?Soybeans (roasted) -- 1 oz (28 g) has 8 g of protein. ?Soybeans (cooked) -- ? cup (90 g) has 11 g of protein. ?Soy milk -- 1 cup (250 mL) has 5-10 g of protein. ?Soy or vegetable patty -- 1 patty has 11 g of protein. ?Sunflower seeds -- 1 oz (28 g) has 5.5 g of protein. ?Buckwheat -- 1 oz (33 g) has 4.3 g of protein. ?Tofu (firm) -- ? cup (124 g) has 20 g of protein. ?Tempeh -- ? cup (83 g) has 16 g of protein. ?The items listed above may not be a complete list of foods high in protein. Actual amounts of protein may differ depending on processing. Contact a dietitian for more information. ?What foods are low in protein? ? ?Low-protein foods contain 3 grams (g) or less of protein per serving. They include: ?Fruits ?Fruit or vegetable juice -- ? cup (125 mL) has 1 g of protein. ?Vegetables ?Beets (raw or cooked) -- ? cup (68 g) has 1.5 g of protein. ?Broccoli (raw or cooked) -- ? cup (44 g) has 2 g of protein. ?Collard greens (raw or cooked) -- ? cup (42 g) has 2 g of protein. ?Green beans (raw or cooked) -- ? cup (83 g) has 1 g of protein. ?Green peas (canned) -- ? cup (80 g) has 3.5 g of protein. ?Potato (baked with skin) -- 1 medium potato (173 g) has 3 g of protein. ?Spinach (cooked) -- ? cup (90 g) has 3 g of protein. ?Squash (cooked) -- ? cup (90 g) has 1.5 g of protein. ?Avocado -- 1 cup (146 g) has 2.7 g of protein. ?Grains ?Bran cereal -- ? cup (30 g) has 2-3 g of protein. ?Bread -- 1 slice has 2.5 g of protein. ?Corn (fresh or cooked) -- ? cup (77 g) has 2 g of protein. ?Flour tortilla -- One 6-inch  (15 cm) tortilla has 2.5 g of protein. ?Muffins -- 1 small muffin (2 oz or 57 g) has 3 g of protein. ?Oatmeal (cooked) -- ? cup (40 g) has 3 g of protein. ?Rice (cooked) -- ? cup (79 g)  has 2.5-3.5 g of protein. ?Dairy ?Cream cheese -- 1 oz (29 g) has 2 g of protein. ?Creamer (half-and-half) -- 1 oz (29 mL) has 1 g of protein. ?Frozen yogurt -- ? cup (72 g) has 3 g of protein. ?Sour cream -- ? cup (75 g) has 2.5 g of protein. ?The items listed above may not be a complete list of foods low in protein. Actual amounts of protein may differ depending on processing. Contact a dietitian for more information. ?Summary ?Protein is a nutrient that your body needs for growth and development, repairing and making cells and tissues, fighting infection, and providing energy. ?Protein is in both plant and animal foods. Some of these foods have more protein than others. ?Depending on your overall health, you may need more or less protein in your diet. Talk to your health care provider about how much protein you need. ?This information is not intended to replace advice given to you by your health care provider. Make sure you discuss any questions you have with your health care provider. ?Document Revised: 08/16/2020 Document Reviewed: 08/16/2020 ?Elsevier Patient Education ? 2023 Elsevier Inc. ? ?

## 2022-02-01 NOTE — Progress Notes (Signed)
Complete Physical ? ?Assessment and Plan: ? ? ?Encounter for Annual Physical Exam with abnormal findings ?Due annually  ?Health Maintenance reviewed ?Healthy lifestyle reviewed and goals set ?- Follow up GYN for PAP, report requested  ?- covid 19 vaccine dates requested ? ?Screening for thyroid disorder/ family history of thyroid (twin) ?-     TSH ? 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  ? ?Screening for hematuria or proteinuria ?-     Urinalysis w microscopic + reflex cultur ?-     Microalbumin / creatinine urine ratio ? ?B12 def ?Newly on supplement; recheck levels and adjust ?-     B12  ? ?Vitamin D deficiency ?-     VITAMIN D 25 Hydroxy (Vit-D Deficiency, Fractures) ? ?Medication management ?-     CBC with Differential/Platelet ?-     CMP/GFR ?-     Magnesium  ? ?Sensation of skipped heart beats ?Improved with caffeine reduction; discussed stress management ?Had recent normal EKG, hx of normal Holter ? ?Screening cholesterol  ?      -      Lipid panel - defer ? ?Overweight - BMI 28 ?Long discussion about weight loss, diet, and exercise ?Recommended diet heavy in fruits and veggies and low in animal meats, cheeses, and dairy products, appropriate calorie intake ?Discussed appropriate weight for height and initial goal (<150 lb) ?Discussed resistance exercises, protein intake 20-30 g with each meal  ?Follow up at next visit ? ?No orders of the defined types were placed in this encounter. ? ? ? ?Discussed med's effects and SE's. Screening labs and tests as requested with regular follow-up as recommended. ?Over 40 minutes of exam, counseling, chart review and critical decision making was performed ? ?Future Appointments  ?Date Time Provider [place]  ?02/02/2023  9:00 AM Darrol Jump, NP [facility] None  ? ? ? ?HPI  ?BP 118/80   Pulse 72   Temp 97.9 ?F (36.6 ?C)   Ht '5\' 3"'  (1.6 m)   Wt 161 lb 3.2 oz (73.1 kg)    LMP 01/10/2022   SpO2 98%   BMI 28.56 kg/m?  ? ?This very nice 41 y.o.female presents for complete physical.  She has Vitamin D deficiency; Nonfunctioning kidney; Overweight (BMI 25.0-29.9); and B12 deficiency on their problem list.  ? ?Patient reports no complaints at this time.  ? 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  ? ?She is seeing obygn Dr. Julien Girt at [facility]. She has had normal pap smears, doing annual PAP smear, hx abnormal 10 years ago, have been normal since.  Periods are normal.  Husband had a vasectomy.  ? ?She has a family hx of melanoma and has been referred to dermatology (Dr. Renda Rolls) for annual screening; she continues to follow up annually as recommended.  ? ?BMI is Body mass index is 28.56 kg/m?., she has been working on diet and exercise. Using walking treadmill desk, walks 3 hours a day, over 10000 steps.  ?She drinks 1 cup of coffee daily. Doesn't drink sweet tea or soda.  ?Estimates 90 fluid ounces of water daily.  ?She does estimate 5-7 servings of fruits/vegetables daily, tries to do lower carb, if does grain typically brown rice.  ?2 servings of red meat weekly.  ?Sleeps well.  ?Wt Readings from Last 3 Encounters:  ?02/01/22 161 lb 3.2 oz (73.1 kg)  ?12/17/20 162 lb (73.5  kg)  ?10/13/20 154 lb (69.9 kg)  ? ?Today their BP is BP: 118/80 ? She does workout. She denies chest pain, shortness of breath, dizziness.  ? ?Hx of palpitations much improved after reducing caffeine.  ? ? She is not on cholesterol medication.The cholesterol last visit was:   ?Lab Results  ?Component Value Date  ? CHOL 185 12/17/2020  ? HDL 78 12/17/2020  ? [place] 94 12/17/2020  ? TRIG 52 12/17/2020  ? CHOLHDL 2.4 12/17/2020  ? ?Last A1C in the office was:  ?Lab Results  ?Component Value Date  ? HGBA1C 5.1 12/10/2017  ? ? She has hx of non-functioning kidney as child, established with [facility] as needed, recently with stable renal function:  ?No results found for:  EGFR ? ?She takes B12 supplement intermittently.  ?Lab Results  ?Component Value Date  ? [id number] 396 12/17/2020  ? ?Finally, patient has history of Vitamin D Deficiency, taking 5000 IU daily, and last vitamin D was  ?Lab Results  ?Component Value Date  ? VD25OH 59 12/17/2020  ?Currently on supplementation taking 5000 IU daily.  ?  ? ?Current Medications:  ?Current Outpatient Medications on File Prior to Visit  ?Medication Sig Dispense Refill  ? Cholecalciferol (VITAMIN D3) 5000 units CAPS Take by mouth.    ? Cyanocobalamin (B-12) 5000 MCG SUBL Place 1 tablet under the tongue daily.    ? Multiple Vitamin (MULTIVITAMIN ADULT PO) Take 1 tablet by mouth daily.    ? ?No current facility-administered medications on file prior to visit.  ? ?Health Maintenance:   ?There is no immunization history for the selected administration types on file for this patient.  ? ?Health Maintenance  ?Topic Date Due  ? PAP SMEAR-Modifier  11/24/2019  ? INFLUENZA VACCINE  04/25/2022  ? TETANUS/TDAP  09/26/2023  ? HIV Screening  Completed  ? HPV VACCINES  Aged Out  ? Hepatitis C Screening  Discontinued  ? ?Influenza: Declines ?Covid 19: 2/2, pfizer summer 2021 - will send info  ? ?Sexually Active: yes STD testing offered, declined ?Pap: 02/16/2021 - OBGYN Dr. Julien Girt, - will request report ?MGM: 02/16/2021 - OBGYN Dr. Julien Girt ? ?Eye exam: My eye MD in [place], Wears glasses for distance, contacts, 08/2021 ?Dentist: Dr. Alpha Gula, q6 months, last visit 09/2021 ?Dermatology: Dr. Renda Rolls, goes annually, last visit 03/2021 ? ?Allergies: No Known Allergies ?Medical History:  ?has Vitamin D deficiency; Nonfunctioning kidney; Overweight (BMI 25.0-29.9); and B12 deficiency on their problem list. ?Surgical History:  ?She  has a past surgical history that includes Lithotripsy (Right, 1987); surgical removal of kidney stones (Right, 1987); and Wisdom tooth extraction. ?Family History:  ?Her family history includes Diabetes type II in her paternal  grandfather; Heart attack (age of onset: 80) in her father; Heart disease in her father; Hyperlipidemia in her maternal grandmother and mother; Hypertension in her maternal grandmother and mother; Hypothyroidism in her sister; Kidney Stones in her father; Lung cancer (age of onset: 28) in her maternal grandmother; Melanoma in her mother. ?Social History:  ? reports that she has never smoked. She has never used smokeless tobacco. She reports current alcohol use. She reports that she does not use drugs. ? ?Review of Systems: ?Review of Systems  ?Constitutional:  Negative for malaise/fatigue and weight loss.  ?HENT:  Negative for hearing loss and tinnitus.   ?Eyes:  Negative for blurred vision and double vision.  ?Respiratory:  Negative for cough, sputum production, shortness of breath and wheezing.   ?Cardiovascular:  Negative for  chest pain, palpitations, orthopnea, claudication, leg swelling and PND.  ?Gastrointestinal:  Negative for abdominal pain, blood in stool, constipation, diarrhea, heartburn, melena, nausea and vomiting.  ?Genitourinary: Negative.   ?Musculoskeletal:  Negative for falls, joint pain and myalgias.  ?Skin:  Negative for rash.  ?Neurological:  Negative for dizziness, tingling, sensory change, weakness and headaches.  ?Endo/Heme/Allergies:  Negative for polydipsia.  ?Psychiatric/Behavioral: Negative.  Negative for depression, memory loss, substance abuse and suicidal ideas. The patient is not nervous/anxious and does not have insomnia.   ?All other systems reviewed and are negative. ? ?Physical Exam: ?Estimated body mass index is 28.56 kg/m? as calculated from the following: ?  Height as of this encounter: '5\' 3"'  (1.6 m). ?  Weight as of this encounter: 161 lb 3.2 oz (73.1 kg). ?BP 118/80   Pulse 72   Temp 97.9 ?F (36.6 ?C)   Ht '5\' 3"'  (1.6 m)   Wt 161 lb 3.2 oz (73.1 kg)   LMP 01/10/2022   SpO2 98%   BMI 28.56 kg/m?  ?General Appearance: Well nourished, in no apparent distress.  ?Eyes:  PERRLA, EOMs, conjunctiva no swelling or erythema ?Sinuses: No Frontal/maxillary tenderness  ?ENT/Mouth: Ext aud canals clear, normal light reflex with TMs without erythema, bulging. Good dentition. No erythema, swelling, o

## 2022-02-02 ENCOUNTER — Other Ambulatory Visit: Payer: Self-pay | Admitting: Adult Health

## 2022-02-02 DIAGNOSIS — R809 Proteinuria, unspecified: Secondary | ICD-10-CM

## 2022-02-02 LAB — COMPLETE METABOLIC PANEL WITH GFR
AG Ratio: 1.6 (calc) (ref 1.0–2.5)
ALT: 10 U/L (ref 6–29)
AST: 16 U/L (ref 10–30)
Albumin: 4.4 g/dL (ref 3.6–5.1)
Alkaline phosphatase (APISO): 50 U/L (ref 31–125)
BUN: 14 mg/dL (ref 7–25)
CO2: 24 mmol/L (ref 20–32)
Calcium: 9.6 mg/dL (ref 8.6–10.2)
Chloride: 104 mmol/L (ref 98–110)
Creat: 0.81 mg/dL (ref 0.50–0.99)
Globulin: 2.8 g/dL (calc) (ref 1.9–3.7)
Glucose, Bld: 79 mg/dL (ref 65–99)
Potassium: 4.4 mmol/L (ref 3.5–5.3)
Sodium: 138 mmol/L (ref 135–146)
Total Bilirubin: 0.8 mg/dL (ref 0.2–1.2)
Total Protein: 7.2 g/dL (ref 6.1–8.1)
eGFR: 93 mL/min/{1.73_m2} (ref >=60)

## 2022-02-02 LAB — URINALYSIS, ROUTINE W REFLEX MICROSCOPIC
Bilirubin Urine: NEGATIVE
Glucose, UA: NEGATIVE
Hgb urine dipstick: NEGATIVE
Ketones, ur: NEGATIVE
Leukocytes,Ua: NEGATIVE
Nitrite: NEGATIVE
Protein, ur: NEGATIVE
Specific Gravity, Urine: 1.005 (ref 1.001–1.035)
pH: 7 (ref 5.0–8.0)

## 2022-02-02 LAB — CBC WITH DIFFERENTIAL/PLATELET
Absolute Monocytes: 387 cells/uL (ref 200–950)
Basophils Absolute: 39 cells/uL (ref 0–200)
Basophils Relative: 0.8 %
Eosinophils Absolute: 142 cells/uL (ref 15–500)
Eosinophils Relative: 2.9 %
HCT: 42.4 % (ref 35.0–45.0)
Hemoglobin: 14.2 g/dL (ref 11.7–15.5)
Lymphs Abs: 1289 cells/uL (ref 850–3900)
MCH: 30.2 pg (ref 27.0–33.0)
MCHC: 33.5 g/dL (ref 32.0–36.0)
MCV: 90.2 fL (ref 80.0–100.0)
MPV: 10 fL (ref 7.5–12.5)
Monocytes Relative: 7.9 %
Neutro Abs: 3043 cells/uL (ref 1500–7800)
Neutrophils Relative %: 62.1 %
Platelets: 306 10*3/uL (ref 140–400)
RBC: 4.7 10*6/uL (ref 3.80–5.10)
RDW: 11.9 % (ref 11.0–15.0)
Total Lymphocyte: 26.3 %
WBC: 4.9 10*3/uL (ref 3.8–10.8)

## 2022-02-02 LAB — VITAMIN D 25 HYDROXY (VIT D DEFICIENCY, FRACTURES): Vit D, 25-Hydroxy: 51 ng/mL (ref 30–100)

## 2022-02-02 LAB — MICROALBUMIN / CREATININE URINE RATIO
Creatinine, Urine: 13 mg/dL — ABNORMAL LOW (ref 20–275)
Microalb Creat Ratio: 54 mcg/mg creat — ABNORMAL HIGH (ref <30)
Microalb, Ur: 0.7 mg/dL

## 2022-02-02 LAB — MAGNESIUM: Magnesium: 2.2 mg/dL (ref 1.5–2.5)

## 2022-02-02 LAB — TSH: TSH: 2.29 mIU/L

## 2022-02-02 LAB — VITAMIN B12: Vitamin B-12: 639 pg/mL (ref 200–1100)

## 2022-02-13 ENCOUNTER — Encounter: Payer: Self-pay | Admitting: Internal Medicine

## 2022-03-06 ENCOUNTER — Other Ambulatory Visit: Payer: No Typology Code available for payment source

## 2022-03-08 ENCOUNTER — Other Ambulatory Visit: Payer: No Typology Code available for payment source

## 2022-03-08 ENCOUNTER — Other Ambulatory Visit: Payer: Self-pay

## 2022-03-08 DIAGNOSIS — R809 Proteinuria, unspecified: Secondary | ICD-10-CM | POA: Diagnosis not present

## 2022-03-09 ENCOUNTER — Encounter: Payer: Self-pay | Admitting: Adult Health

## 2022-03-09 LAB — MICROALBUMIN / CREATININE URINE RATIO
Creatinine, Urine: 24 mg/dL (ref 20–275)
Microalb, Ur: <0.2 mg/dL

## 2022-07-05 ENCOUNTER — Ambulatory Visit (INDEPENDENT_AMBULATORY_CARE_PROVIDER_SITE_OTHER): Payer: No Typology Code available for payment source | Admitting: Nurse Practitioner

## 2022-07-05 ENCOUNTER — Encounter: Payer: Self-pay | Admitting: Internal Medicine

## 2022-07-05 VITALS — BP 124/88 | HR 73 | Temp 97.5°F | Ht 63.0 in | Wt 164.0 lb

## 2022-07-05 DIAGNOSIS — N3000 Acute cystitis without hematuria: Secondary | ICD-10-CM

## 2022-07-05 DIAGNOSIS — R300 Dysuria: Secondary | ICD-10-CM | POA: Diagnosis not present

## 2022-07-05 DIAGNOSIS — R350 Frequency of micturition: Secondary | ICD-10-CM | POA: Diagnosis not present

## 2022-07-05 MED ORDER — NITROFURANTOIN MONOHYD MACRO 100 MG PO CAPS: 100.0000 mg | ORAL_CAPSULE | Freq: Two times a day (BID) | ORAL | 0 refills | Status: AC

## 2022-07-05 NOTE — Progress Notes (Signed)
Assessment and Plan:  Christina Montes was seen today for an episodic visit.  Diagnoses and all order for this visit:  1. Acute cystitis without hematuria Stay well hydrated to help flush system. Consider cranberry supplement.  - Urinalysis, Routine w reflex microscopic - Urine Culture - nitrofurantoin, macrocrystal-monohydrate, (MACROBID) 100 MG capsule; Take 1 capsule (100 mg total) by mouth 2 (two) times daily for 5 days.  Dispense: 10 capsule; Refill: 0  2. Dysuria AZO OTC PRN as directed.  3. Urinary frequency  Notify office for further evaluation and treatment, questions or concerns if s/s fail to improve. The risks and benefits of my recommendations, as well as other treatment options were discussed with the patient today. Questions were answered.  Further disposition pending results of labs. Discussed med's effects and SE's.    Over 15 minutes of exam, counseling, chart review, and critical decision making was performed.   Future Appointments  Date Time Provider Department Center  02/02/2023  9:00 AM Christina Montes, Archie Patten, NP [facility] None    ------------------------------------------------------------------------------------------------------------------   HPI BP 124/88   Pulse 73   Temp (!) 97.5 F (36.4 C)   Ht 5\' 3"  (1.6 m)   Wt 164 lb (74.4 kg)   SpO2 99%   BMI 29.05 kg/m    Patient complains of dysuria, urinary frequency. She has had symptoms for 2 days. Patient denies complaints of back pain, fever, stomach ache, and vaginal discharge. Patient does not have a history of recurrent UTI. Patient does not have a history of pyelonephritis. She is currently on her menstrual cycle.    Past Medical History:  Diagnosis Date   Anemia    Chronic kidney disease    1 functioning kidney, hx stones   Heart murmur    Kidney stones    Spontaneous vaginal delivery 08/21/2014     No Known Allergies  Current Outpatient Medications on File Prior to Visit  Medication Sig    Cholecalciferol (VITAMIN D3) 5000 units CAPS Take by mouth.   Cyanocobalamin (B-12) 5000 MCG SUBL Place 1 tablet under the tongue daily.   Multiple Vitamin (MULTIVITAMIN ADULT PO) Take 1 tablet by mouth daily.   No current facility-administered medications on file prior to visit.    ROS: all negative except what is noted in the HPI.   Physical Exam:  BP 124/88   Pulse 73   Temp (!) 97.5 F (36.4 C)   Ht 5\' 3"  (1.6 m)   Wt 164 lb (74.4 kg)   SpO2 99%   BMI 29.05 kg/m   General Appearance: NAD.  Awake, conversant and cooperative. Eyes: PERRLA, EOMs intact.  Sclera white.  Conjunctiva without erythema. Sinuses: No frontal/maxillary tenderness.  No nasal discharge. Nares patent.  ENT/Mouth: Ext aud canals clear.  Bilateral TMs w/DOL and without erythema or bulging. Hearing intact.  Posterior pharynx without swelling or exudate.  Tonsils without swelling or erythema.  Neck: Supple.  No masses, nodules or thyromegaly. Respiratory: Effort is regular with non-labored breathing. Breath sounds are equal bilaterally without rales, rhonchi, wheezing or stridor.  Cardio: RRR with no MRGs. Brisk peripheral pulses without edema.  Abdomen: Active BS in all four quadrants.  Soft and non-tender without guarding, rebound tenderness, hernias or masses. Lymphatics: Non tender without lymphadenopathy.  Musculoskeletal: Full ROM, 5/5 strength, normal ambulation.  No clubbing or cyanosis. Skin: Appropriate color for ethnicity. Warm without rashes, lesions, ecchymosis, ulcers.  Neuro: CN II-XII grossly normal. Normal muscle tone without cerebellar symptoms and intact sensation.  Psych: AO X 3,  appropriate mood and affect, insight and judgment.     Darrol Jump, NP 12:04 PM [name]

## 2022-07-07 ENCOUNTER — Encounter: Payer: Self-pay | Admitting: Nurse Practitioner

## 2022-07-07 LAB — URINALYSIS, ROUTINE W REFLEX MICROSCOPIC
Bilirubin Urine: NEGATIVE
Glucose, UA: NEGATIVE
Hgb urine dipstick: NEGATIVE
Ketones, ur: NEGATIVE
Leukocytes,Ua: NEGATIVE
Nitrite: NEGATIVE
Protein, ur: NEGATIVE
Specific Gravity, Urine: 1.006 (ref 1.001–1.035)
pH: 7 (ref 5.0–8.0)

## 2022-07-07 LAB — URINE CULTURE
MICRO NUMBER:: 14037903
SPECIMEN QUALITY:: ADEQUATE

## 2023-02-02 ENCOUNTER — Encounter: Payer: No Typology Code available for payment source | Admitting: Nurse Practitioner

## 2023-03-06 ENCOUNTER — Ambulatory Visit (INDEPENDENT_AMBULATORY_CARE_PROVIDER_SITE_OTHER): Payer: 59 | Admitting: Nurse Practitioner

## 2023-03-06 ENCOUNTER — Encounter: Payer: Self-pay | Admitting: Nurse Practitioner

## 2023-03-06 VITALS — BP 118/70 | HR 68 | Temp 97.8°F | Resp 17 | Ht 63.0 in | Wt 162.0 lb

## 2023-03-06 DIAGNOSIS — Z0000 Encounter for general adult medical examination without abnormal findings: Secondary | ICD-10-CM

## 2023-03-06 DIAGNOSIS — Z131 Encounter for screening for diabetes mellitus: Secondary | ICD-10-CM

## 2023-03-06 DIAGNOSIS — Z1389 Encounter for screening for other disorder: Secondary | ICD-10-CM

## 2023-03-06 DIAGNOSIS — Z136 Encounter for screening for cardiovascular disorders: Secondary | ICD-10-CM

## 2023-03-06 DIAGNOSIS — Z79899 Other long term (current) drug therapy: Secondary | ICD-10-CM

## 2023-03-06 DIAGNOSIS — I10 Essential (primary) hypertension: Secondary | ICD-10-CM

## 2023-03-06 DIAGNOSIS — R002 Palpitations: Secondary | ICD-10-CM | POA: Diagnosis not present

## 2023-03-06 DIAGNOSIS — Z130 Encounter for screening for diseases of the blood and blood-forming organs and certain disorders involving the immune mechanism: Secondary | ICD-10-CM

## 2023-03-06 DIAGNOSIS — E663 Overweight: Secondary | ICD-10-CM

## 2023-03-06 DIAGNOSIS — E559 Vitamin D deficiency, unspecified: Secondary | ICD-10-CM

## 2023-03-06 DIAGNOSIS — E538 Deficiency of other specified B group vitamins: Secondary | ICD-10-CM

## 2023-03-06 DIAGNOSIS — N289 Disorder of kidney and ureter, unspecified: Secondary | ICD-10-CM

## 2023-03-06 DIAGNOSIS — Z0001 Encounter for general adult medical examination with abnormal findings: Secondary | ICD-10-CM

## 2023-03-06 DIAGNOSIS — Z1329 Encounter for screening for other suspected endocrine disorder: Secondary | ICD-10-CM

## 2023-03-06 DIAGNOSIS — Z13220 Encounter for screening for lipoid disorders: Secondary | ICD-10-CM

## 2023-03-06 NOTE — Progress Notes (Signed)
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   Screening for hematuria or proteinuria -     Urinalysis w microscopic + reflex cultur -     Microalbumin / creatinine urine ratio  B12 def Continue supplement; recheck levels and adjust -     B12   Vitamin D deficiency -     VITAMIN D 25 Hydroxy (Vit-D Deficiency, Fractures)  Medication management All medications discussed and reviewed in full. All questions and concerns regarding medications addressed.    Sensation of skipped heart beats Improved with caffeine reduction; discussed stress management Had recent normal EKG, hx of normal Holter  Screening cholesterol  Discussed lifestyle modifications. Recommended diet heavy in fruits and veggies, omega 3's. Decrease consumption of animal meats, cheeses, and dairy products. Remain active and exercise as tolerated. Continue to monitor. Check lipids/TSH  Overweight - BMI 28 Discussed appropriate BMI Diet modification. Physical activity. Encouraged/praised to build confidence.  Screening for IDA Check CBC and anemia panel, iron, ferritin, TIBC  Screening for DM Check A1c/Insulin  Orders Placed This Encounter  Procedures   CBC with Differential/Platelet   COMPLETE METABOLIC PANEL WITH GFR   Lipid panel   TSH   Hemoglobin A1c   Insulin, random   VITAMIN D 25 Hydroxy (Vit-D Deficiency, Fractures)   Urinalysis, Routine w reflex microscopic   Microalbumin / creatinine urine ratio   Vitamin B12   Iron, TIBC and Ferritin Panel   EKG 12-Lead   Notify office for further evaluation and treatment, questions or concerns if any reported  s/s fail to improve.   The patient was advised to call back or seek an in-person evaluation if any symptoms worsen or if the condition fails to improve as anticipated.   Further disposition pending results of labs. Discussed med's effects and SE's.    I discussed the assessment and treatment plan with the patient. The patient was provided an opportunity to ask questions and all were answered. The patient agreed with the plan and demonstrated an understanding of the instructions.  Discussed med's effects and SE's. Screening labs and tests as requested with regular follow-up as recommended.  I provided 40 minutes of face-to-face time during this encounter including counseling, chart review, and critical decision making was preformed.  Today's Plan of Care is based on a patient-centered health care approach known as shared decision making - the decisions, tests and treatments allow for patient preferences and values to be balanced with clinical evidence.     Future Appointments  Date Time Provider Department Center  03/05/2024  9:00 AM Zonnie Landen, Archie Patten, NP [facility] None     HPI  BP 118/70   Pulse 68   Temp 97.8 F (36.6 C)   Resp 17   Ht 5\' 3"  (1.6 m)   Wt 162 lb (73.5 kg)   SpO2 99%   BMI 28.70 kg/m   This very nice 42 y.o.female presents for complete physical.  She has Vitamin D deficiency; Nonfunctioning kidney; Overweight (BMI 25.0-29.9); and B12 deficiency on their problem list.   Patient reports no complaints at this time. She has just returned from family vacation to [facility].  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   She is seeing obygn Dr. Renaldo Fiddler at [facility]. She has had normal pap smears, doing annual PAP smear, hx abnormal 10 years ago, have been normal since.  Periods are normal.  Husband had a vasectomy.   She has a family hx of melanoma and has been referred to dermatology (Dr. Sharyn Lull) for annual screening; she continues  to follow up annually as recommended. Has updated apt in 03/2023.  BMI is Body mass index is 28.7 kg/m., she has been working on diet and exercise.  Wt Readings from Last 3 Encounters:  03/06/23 162 lb (73.5 kg)  07/05/22 164 lb (74.4 kg)  02/01/22 161 lb 3.2 oz (73.1 kg)   Today their BP is BP: 118/70  She does workout. She denies chest pain, shortness of breath, dizziness.   Hx of palpitations much improved after reducing caffeine.    She is not on cholesterol medication.The cholesterol last visit was:   Lab Results  Component Value Date   CHOL 185 12/17/2020   HDL 78 12/17/2020   LDLCALC 94 12/17/2020   TRIG 52 12/17/2020   CHOLHDL 2.4 12/17/2020   Last Z6X in the office was:  Lab Results  Component Value Date   HGBA1C 5.1 12/10/2017    She has hx of non-functioning kidney as child, established with [facility] as needed, recently with stable renal function:  Lab Results  Component Value Date   EGFR 93 02/01/2022    She takes B12 supplement intermittently.  Lab Results  Component Value Date   VITAMINB12 639 02/01/2022   Finally, patient has history of Vitamin D Deficiency, taking 5000 IU daily, and last vitamin D was  Lab Results  Component Value Date   VD25OH 51 02/01/2022  Currently on supplementation taking 5000 IU daily.     Current Medications:  Current Outpatient Medications on File Prior to Visit  Medication Sig Dispense Refill   Cholecalciferol (VITAMIN D3) 5000 units CAPS Take by mouth.     Cyanocobalamin (B-12) 5000 MCG SUBL Place 1 tablet under the tongue daily.     Multiple Vitamin (MULTIVITAMIN ADULT PO) Take 1 tablet by mouth daily.     No current facility-administered medications on file prior to visit.   Health Maintenance:   Immunization History  Administered Date(s) Administered   PFIZER Comirnaty(Gray Top)Covid-19 Tri-Sucrose Vaccine 05/10/2020, 06/07/2020     Health Maintenance  Topic Date Due   DTaP/Tdap/Td (1 - Tdap) Never  done   COVID-19 Vaccine (3 - Pfizer risk series) 07/05/2020   INFLUENZA VACCINE  04/26/2023   PAP SMEAR-Modifier  02/17/2024   HIV Screening  Completed   HPV VACCINES  Aged Out   Hepatitis C Screening  Discontinued   Influenza: Declines Covid 19: 2/2, pfizer summer 2021 - will send info   Sexually Active: yes STD testing offered, declined Pap: 02/16/2021 - OBGYN Dr. Renaldo Fiddler MGM: 02/16/2021 - OBGYN Dr. Renaldo Fiddler  Eye exam: My eye MD in [place], Wears glasses for distance, contacts, 08/2022 Dentist: Dr. Lequita Asal, q6 months, last visit 09/2022 Dermatology: Dr. Sharyn Lull, goes annually, last visit 03/2021  Allergies: No Known Allergies Medical History:  has Vitamin D deficiency; Nonfunctioning kidney; Overweight (BMI 25.0-29.9); and B12 deficiency on their problem list. Surgical History:  She  has a past surgical history that includes Lithotripsy (Right, 1987); surgical removal of kidney stones (Right, 1987); and Wisdom tooth extraction. Family History:  Her family history includes Diabetes type II in her  paternal grandfather; Heart attack (age of onset: 70) in her father; Heart disease in her father; Hyperlipidemia in her maternal grandmother and mother; Hypertension in her maternal grandmother and mother; Hypothyroidism in her sister; Kidney Stones in her father; Lung cancer (age of onset: 17) in her maternal grandmother; Melanoma in her mother. Social History:   reports that she has never smoked. She has never used smokeless tobacco. She reports current alcohol use. She reports that she does not use drugs.  Review of Systems: Review of Systems  Constitutional:  Negative for malaise/fatigue and weight loss.  HENT:  Negative for hearing loss and tinnitus.   Eyes:  Negative for blurred vision and double vision.  Respiratory:  Negative for cough, sputum production, shortness of breath and wheezing.   Cardiovascular:  Negative for chest pain, palpitations, orthopnea, claudication, leg  swelling and PND.  Gastrointestinal:  Negative for abdominal pain, blood in stool, constipation, diarrhea, heartburn, melena, nausea and vomiting.  Genitourinary: Negative.   Musculoskeletal:  Negative for falls, joint pain and myalgias.  Skin:  Negative for rash.  Neurological:  Negative for dizziness, tingling, sensory change, weakness and headaches.  Endo/Heme/Allergies:  Negative for polydipsia.  Psychiatric/Behavioral: Negative.  Negative for depression, memory loss, substance abuse and suicidal ideas. The patient is not nervous/anxious and does not have insomnia.   All other systems reviewed and are negative.   Physical Exam: Estimated body mass index is 28.7 kg/m as calculated from the following:   Height as of this encounter: 5\' 3"  (1.6 m).   Weight as of this encounter: 162 lb (73.5 kg). BP 118/70   Pulse 68   Temp 97.8 F (36.6 C)   Resp 17   Ht 5\' 3"  (1.6 m)   Wt 162 lb (73.5 kg)   SpO2 99%   BMI 28.70 kg/m  General Appearance: Well nourished, in no apparent distress.  Eyes: PERRLA, EOMs, conjunctiva no swelling or erythema Sinuses: No Frontal/maxillary tenderness  ENT/Mouth: Ext aud canals clear, normal light reflex with TMs without erythema, bulging. Good dentition. No erythema, swelling, or exudate on post pharynx. Tonsils not swollen or erythematous. Hearing normal.  Neck: Supple, thyroid normal. No bruits  Respiratory: Respiratory effort normal, BS equal bilaterally without rales, rhonchi, wheezing or stridor.  Cardio: RRR without murmurs, rubs or gallops. Brisk peripheral pulses without edema.  Chest: symmetric, with normal excursions and percussion.  Breasts: Defer to GYN Abdomen: Soft, nontender, no guarding, rebound, hernias, masses, or organomegaly.  Lymphatics: Non tender without lymphadenopathy.  Genitourinary: Defer to GYN Musculoskeletal: Full ROM all peripheral extremities,5/5 strength, and normal gait.  Skin: Warm, dry without rashes, lesions,  ecchymosis. Neuro: Cranial nerves intact, reflexes equal bilaterally. Normal muscle tone, no cerebellar symptoms. Sensation intact.  Psych: Awake and oriented X 3, normal affect, Insight and Judgment appropriate.   EKG: NSR  Kathleen Likins 9:36 AM [facility]

## 2023-03-06 NOTE — Patient Instructions (Signed)

## 2023-03-07 LAB — COMPLETE METABOLIC PANEL WITH GFR
AG Ratio: 1.6 (calc) (ref 1.0–2.5)
ALT: 10 U/L (ref 6–29)
AST: 14 U/L (ref 10–30)
Albumin: 4.5 g/dL (ref 3.6–5.1)
Alkaline phosphatase (APISO): 54 U/L (ref 31–125)
BUN: 15 mg/dL (ref 7–25)
CO2: 26 mmol/L (ref 20–32)
Calcium: 9.2 mg/dL (ref 8.6–10.2)
Chloride: 102 mmol/L (ref 98–110)
Creat: 0.83 mg/dL (ref 0.50–0.99)
Globulin: 2.8 g/dL (calc) (ref 1.9–3.7)
Glucose, Bld: 86 mg/dL (ref 65–99)
Potassium: 4.1 mmol/L (ref 3.5–5.3)
Sodium: 137 mmol/L (ref 135–146)
Total Bilirubin: 0.8 mg/dL (ref 0.2–1.2)
Total Protein: 7.3 g/dL (ref 6.1–8.1)
eGFR: 90 mL/min/{1.73_m2} (ref >=60)

## 2023-03-07 LAB — IRON,TIBC AND FERRITIN PANEL
%SAT: 24 % (calc) (ref 16–45)
Ferritin: 51 ng/mL (ref 16–232)
Iron: 77 ug/dL (ref 40–190)
TIBC: 318 mcg/dL (calc) (ref 250–450)

## 2023-03-07 LAB — TSH: TSH: 2.08 mIU/L

## 2023-03-07 LAB — CBC WITH DIFFERENTIAL/PLATELET
Absolute Monocytes: 626 cells/uL (ref 200–950)
Basophils Absolute: 26 cells/uL (ref 0–200)
Basophils Relative: 0.3 %
Eosinophils Absolute: 139 cells/uL (ref 15–500)
Eosinophils Relative: 1.6 %
HCT: 43.1 % (ref 35.0–45.0)
Hemoglobin: 14.2 g/dL (ref 11.7–15.5)
Lymphs Abs: 1122 cells/uL (ref 850–3900)
MCH: 30 pg (ref 27.0–33.0)
MCHC: 32.9 g/dL (ref 32.0–36.0)
MCV: 91.1 fL (ref 80.0–100.0)
MPV: 9.6 fL (ref 7.5–12.5)
Monocytes Relative: 7.2 %
Neutro Abs: 6786 cells/uL (ref 1500–7800)
Neutrophils Relative %: 78 %
Platelets: 308 10*3/uL (ref 140–400)
RBC: 4.73 10*6/uL (ref 3.80–5.10)
RDW: 11.8 % (ref 11.0–15.0)
Total Lymphocyte: 12.9 %
WBC: 8.7 10*3/uL (ref 3.8–10.8)

## 2023-03-07 LAB — MICROALBUMIN / CREATININE URINE RATIO
Creatinine, Urine: 21 mg/dL (ref 20–275)
Microalb, Ur: <0.2 mg/dL

## 2023-03-07 LAB — URINALYSIS, ROUTINE W REFLEX MICROSCOPIC
Bacteria, UA: NONE SEEN /HPF
Bilirubin Urine: NEGATIVE
Glucose, UA: NEGATIVE
Hgb urine dipstick: NEGATIVE
Hyaline Cast: NONE SEEN /LPF
Ketones, ur: NEGATIVE
Nitrite: NEGATIVE
Protein, ur: NEGATIVE
RBC / HPF: NONE SEEN /HPF (ref 0–2)
Specific Gravity, Urine: 1.007 (ref 1.001–1.035)
Squamous Epithelial / HPF: NONE SEEN /HPF (ref <=5)
WBC, UA: NONE SEEN /HPF (ref 0–5)
pH: 7.5 (ref 5.0–8.0)

## 2023-03-07 LAB — LIPID PANEL
Cholesterol: 176 mg/dL (ref <200)
HDL: 84 mg/dL (ref >=50)
LDL Cholesterol (Calc): 78 mg/dL (calc)
Non-HDL Cholesterol (Calc): 92 mg/dL (calc) (ref <130)
Total CHOL/HDL Ratio: 2.1 (calc) (ref <5.0)
Triglycerides: 48 mg/dL (ref <150)

## 2023-03-07 LAB — VITAMIN B12: Vitamin B-12: 663 pg/mL (ref 200–1100)

## 2023-03-07 LAB — HEMOGLOBIN A1C
Hgb A1c MFr Bld: 5.2 % of total Hgb (ref <5.7)
Mean Plasma Glucose: 103 mg/dL
eAG (mmol/L): 5.7 mmol/L

## 2023-03-07 LAB — VITAMIN D 25 HYDROXY (VIT D DEFICIENCY, FRACTURES): Vit D, 25-Hydroxy: 48 ng/mL (ref 30–100)

## 2023-03-07 LAB — INSULIN, RANDOM: Insulin: 5.5 u[IU]/mL

## 2023-03-07 LAB — MICROSCOPIC MESSAGE

## 2023-09-28 ENCOUNTER — Encounter: Payer: Self-pay | Admitting: Nurse Practitioner

## 2023-10-02 ENCOUNTER — Ambulatory Visit (INDEPENDENT_AMBULATORY_CARE_PROVIDER_SITE_OTHER): Payer: 59 | Admitting: Nurse Practitioner

## 2023-10-02 ENCOUNTER — Encounter: Payer: Self-pay | Admitting: Nurse Practitioner

## 2023-10-02 VITALS — BP 116/76 | HR 82 | Temp 98.7°F | Ht 63.0 in | Wt 164.0 lb

## 2023-10-02 DIAGNOSIS — R350 Frequency of micturition: Secondary | ICD-10-CM

## 2023-10-02 DIAGNOSIS — R8290 Unspecified abnormal findings in urine: Secondary | ICD-10-CM

## 2023-10-02 MED ORDER — NITROFURANTOIN MONOHYD MACRO 100 MG PO CAPS: 100.0000 mg | ORAL_CAPSULE | Freq: Two times a day (BID) | ORAL | 0 refills | Status: AC

## 2023-10-02 NOTE — Patient Instructions (Signed)
Urinary Tract Infection, Adult A urinary tract infection (UTI) is an infection of any part of the urinary tract. The urinary tract includes: The kidneys. The ureters. The bladder. The urethra. These organs make, store, and get rid of pee (urine) in the body. What are the causes? This infection is caused by germs (bacteria) in your genital area. These germs grow and cause swelling (inflammation) of your urinary tract. What increases the risk? The following factors may make you more likely to develop this condition: Using a small, thin tube (catheter) to drain pee. Not being able to control when you pee or poop (incontinence). Being female. If you are female, these things can increase the risk: Using these methods to prevent pregnancy: A medicine that kills sperm (spermicide). A device that blocks sperm (diaphragm). Having low levels of a female hormone (estrogen). Being pregnant. You are more likely to develop this condition if: You have genes that add to your risk. You are sexually active. You take antibiotic medicines. You have trouble peeing because of: A prostate that is bigger than normal, if you are female. A blockage in the part of your body that drains pee from the bladder. A kidney stone. A nerve condition that affects your bladder. Not getting enough to drink. Not peeing often enough. You have other conditions, such as: Diabetes. A weak disease-fighting system (immune system). Sickle cell disease. Gout. Injury of the spine. What are the signs or symptoms? Symptoms of this condition include: Needing to pee right away. Peeing small amounts often. Pain or burning when peeing. Blood in the pee. Pee that smells bad or not like normal. Trouble peeing. Pee that is cloudy. Fluid coming from the vagina, if you are female. Pain in the belly or lower back. Other symptoms include: Vomiting. Not feeling hungry. Feeling mixed up (confused). This may be the first symptom in  older adults. Being tired and grouchy (irritable). A fever. Watery poop (diarrhea). How is this treated? Taking antibiotic medicine. Taking other medicines. Drinking enough water. In some cases, you may need to see a specialist. Follow these instructions at home:  Medicines Take over-the-counter and prescription medicines only as told by your doctor. If you were prescribed an antibiotic medicine, take it as told by your doctor. Do not stop taking it even if you start to feel better. General instructions Make sure you: Pee until your bladder is empty. Do not hold pee for a long time. Empty your bladder after sex. Wipe from front to back after peeing or pooping if you are a female. Use each tissue one time when you wipe. Drink enough fluid to keep your pee pale yellow. Keep all follow-up visits. Contact a doctor if: You do not get better after 1-2 days. Your symptoms go away and then come back. Get help right away if: You have very bad back pain. You have very bad pain in your lower belly. You have a fever. You have chills. You feeling like you will vomit or you vomit. Summary A urinary tract infection (UTI) is an infection of any part of the urinary tract. This condition is caused by germs in your genital area. There are many risk factors for a UTI. Treatment includes antibiotic medicines. Drink enough fluid to keep your pee pale yellow. This information is not intended to replace advice given to you by your health care provider. Make sure you discuss any questions you have with your health care provider. Document Revised: 04/18/2020 Document Reviewed: 04/23/2020 Elsevier Patient Education    2024 [employer]vier Inc[employer]  

## 2023-10-02 NOTE — Progress Notes (Signed)
 Assessment and Plan:  Christina Montes was seen today for an episodic visit.  Diagnoses and all order for this visit:  1. Urinary frequency (Primary) Start Macrobid  as directed - discussed possible interstitial cystitis versus prolpase if no UA clean and culture negative.  Stay well hydrated to keep urinary system well flushed Consider daily cranberry juice or oral supplement to help any bacteria from adhering to bladder wall causing increase for infection. Monitor for any increase in fever, chills, N/V, abdominal pain, hematuria.   Contact office or report to ER for further evaluation if s/s fail to improve or any sign of worsening infection as noted above.  - Urinalysis, Routine w reflex microscopic - Urine Culture - nitrofurantoin , macrocrystal-monohydrate, (MACROBID ) 100 MG capsule; Take 1 capsule (100 mg total) by mouth 2 (two) times daily for 5 days.  Dispense: 10 capsule; Refill: 0  2. Malodorous urine/Cloudy urine Stay well hydrated to keep urinary system well flushed.  Notify office for further evaluation and treatment, questions or concerns if s/s fail to improve. The risks and benefits of my recommendations, as well as other treatment options were discussed with the patient today. Questions were answered.  Further disposition pending results of labs. Discussed med's effects and SE's.    Over 20 minutes of exam, counseling, chart review, and critical decision making was performed.   Future Appointments  Date Time Provider Department Center  03/05/2024  9:00 AM Izaiyah Kleinman, [place], NP [facility] None    ------------------------------------------------------------------------------------------------------------------   HPI BP 116/76   Pulse 82   Temp 98.7 F (37.1 C)   Ht 5' 3 (1.6 m)   Wt 164 lb (74.4 kg)   SpO2 99%   BMI 29.05 kg/m    Patient complains of abnormal smelling urine, frequency, and cloudy urine . She has had symptoms for 1  month . Patient denies back  pain, congestion, cough, fever, headache, rhinitis, sorethroat, stomach ache, and vaginal discharge. Patient does have a history of recurrent UTI. Patient does not have a history of pyelonephritis.    Past Medical History:  Diagnosis Date   Anemia    Chronic kidney disease    1 functioning kidney, hx stones   Heart murmur    Kidney stones    Spontaneous vaginal delivery 08/21/2014     No Known Allergies  Current Outpatient Medications on File Prior to Visit  Medication Sig   Cholecalciferol (VITAMIN D3) 5000 units CAPS Take by mouth.   Cyanocobalamin (B-12) 5000 MCG SUBL Place 1 tablet under the tongue daily.   Multiple Vitamin (MULTIVITAMIN ADULT PO) Take 1 tablet by mouth daily.   No current facility-administered medications on file prior to visit.    ROS: all negative except what is noted in the HPI.   Physical Exam:  BP 116/76   Pulse 82   Temp 98.7 F (37.1 C)   Ht 5' 3 (1.6 m)   Wt 164 lb (74.4 kg)   SpO2 99%   BMI 29.05 kg/m   General Appearance: NAD.  Awake, conversant and cooperative. Eyes: PERRLA, EOMs intact.  Sclera white.  Conjunctiva without erythema. Sinuses: No frontal/maxillary tenderness.  No nasal discharge. Nares patent.  ENT/Mouth: Ext aud canals clear.  Bilateral TMs w/DOL and without erythema or bulging. Hearing intact.  Posterior pharynx without swelling or exudate.  Tonsils without swelling or erythema.  Neck: Supple.  No masses, nodules or thyromegaly. Respiratory: Effort is regular with non-labored breathing. Breath sounds are equal bilaterally without rales, rhonchi, wheezing or stridor.  Cardio: RRR with no MRGs. Brisk peripheral pulses without edema.  Abdomen: Active BS in all four quadrants.  Soft and non-tender without guarding, rebound tenderness, hernias or masses. Lymphatics: Non tender without lymphadenopathy.  Musculoskeletal: Full ROM, 5/5 strength, normal ambulation.  No clubbing or cyanosis. Skin: Appropriate color for ethnicity.  Warm without rashes, lesions, ecchymosis, ulcers.  Neuro: CN II-XII grossly normal. Normal muscle tone without cerebellar symptoms and intact sensation.   Psych: AO X 3,  appropriate mood and affect, insight and judgment.     [place] NECESSARY, NP 1:55 PM [facility],o

## 2023-10-04 LAB — URINALYSIS, ROUTINE W REFLEX MICROSCOPIC
Bacteria, UA: NONE SEEN /HPF
Bilirubin Urine: NEGATIVE
Glucose, UA: NEGATIVE
Hgb urine dipstick: NEGATIVE
Hyaline Cast: NONE SEEN /LPF
Ketones, ur: NEGATIVE
Nitrite: NEGATIVE
Protein, ur: NEGATIVE
RBC / HPF: NONE SEEN /HPF (ref 0–2)
Specific Gravity, Urine: 1.005 (ref 1.001–1.035)
Squamous Epithelial / HPF: NONE SEEN /HPF (ref <=5)
WBC, UA: NONE SEEN /HPF (ref 0–5)
pH: 7.5 (ref 5.0–8.0)

## 2023-10-04 LAB — URINE CULTURE
MICRO NUMBER:: 15927952
SPECIMEN QUALITY:: ADEQUATE

## 2023-10-05 ENCOUNTER — Encounter: Payer: Self-pay | Admitting: Nurse Practitioner

## 2023-10-07 MED ORDER — CIPROFLOXACIN HCL 250 MG PO TABS: ORAL_TABLET | ORAL | 0 refills | Status: AC

## 2024-03-05 ENCOUNTER — Encounter: Payer: No Typology Code available for payment source | Admitting: Nurse Practitioner

## 2024-03-05 ENCOUNTER — Encounter: Payer: No Typology Code available for payment source | Admitting: Internal Medicine
